# Patient Record
Sex: Female | Born: 1940
Health system: Southern US, Community
[De-identification: ages and names within clinical notes are randomized; demographics above are authoritative.]

## PROBLEM LIST (undated history)

## (undated) DIAGNOSIS — J449 Chronic obstructive pulmonary disease, unspecified: Secondary | ICD-10-CM

## (undated) DIAGNOSIS — K219 Gastro-esophageal reflux disease without esophagitis: Secondary | ICD-10-CM

## (undated) DIAGNOSIS — M797 Fibromyalgia: Secondary | ICD-10-CM

## (undated) HISTORY — PX: CHOLECYSTECTOMY: SHX55

## (undated) HISTORY — PX: BREAST SURGERY: SHX581

---

## 2014-02-28 ENCOUNTER — Inpatient Hospital Stay (HOSPITAL_COMMUNITY)
Admission: AD | Admit: 2014-02-28 | Discharge: 2014-03-03 | DRG: 247 | Disposition: A | Discharge status: Home / Self Care | Payer: Medicare Other | Source: Other Acute Inpatient Hospital | Attending: Cardiovascular Disease | Admitting: Cardiovascular Disease

## 2014-02-28 ENCOUNTER — Encounter (HOSPITAL_COMMUNITY): Payer: Self-pay | Admitting: Cardiology

## 2014-02-28 DIAGNOSIS — I209 Angina pectoris, unspecified: Secondary | ICD-10-CM | POA: Diagnosis not present

## 2014-02-28 DIAGNOSIS — Z7982 Long term (current) use of aspirin: Secondary | ICD-10-CM

## 2014-02-28 DIAGNOSIS — I2510 Atherosclerotic heart disease of native coronary artery without angina pectoris: Secondary | ICD-10-CM | POA: Diagnosis present

## 2014-02-28 DIAGNOSIS — I10 Essential (primary) hypertension: Secondary | ICD-10-CM | POA: Diagnosis present

## 2014-02-28 DIAGNOSIS — R079 Chest pain, unspecified: Secondary | ICD-10-CM | POA: Diagnosis not present

## 2014-02-28 DIAGNOSIS — Z79899 Other long term (current) drug therapy: Secondary | ICD-10-CM

## 2014-02-28 DIAGNOSIS — E785 Hyperlipidemia, unspecified: Secondary | ICD-10-CM | POA: Diagnosis present

## 2014-02-28 DIAGNOSIS — I959 Hypotension, unspecified: Secondary | ICD-10-CM | POA: Diagnosis not present

## 2014-02-28 DIAGNOSIS — I214 Non-ST elevation (NSTEMI) myocardial infarction: Secondary | ICD-10-CM | POA: Diagnosis present

## 2014-02-28 DIAGNOSIS — Z87891 Personal history of nicotine dependence: Secondary | ICD-10-CM

## 2014-02-28 HISTORY — DX: Gastro-esophageal reflux disease without esophagitis: K21.9

## 2014-02-28 HISTORY — DX: Fibromyalgia: M79.7

## 2014-02-28 LAB — CBC WITH DIFFERENTIAL/PLATELET
Basophils Absolute: 0 10*3/uL (ref 0.0–0.1)
Basophils Relative: 0 % (ref 0–1)
Eosinophils Absolute: 0.1 10*3/uL (ref 0.0–0.7)
Eosinophils Relative: 1 % (ref 0–5)
HCT: 38 % (ref 36.0–46.0)
Hemoglobin: 13 g/dL (ref 12.0–15.0)
Lymphocytes Relative: 36 % (ref 12–46)
Lymphs Abs: 4.4 10*3/uL — ABNORMAL HIGH (ref 0.7–4.0)
MCH: 31.9 pg (ref 26.0–34.0)
MCHC: 34.2 g/dL (ref 30.0–36.0)
MCV: 93.1 fL (ref 78.0–100.0)
Monocytes Absolute: 0.7 10*3/uL (ref 0.1–1.0)
Monocytes Relative: 6 % (ref 3–12)
Neutro Abs: 7 10*3/uL (ref 1.7–7.7)
Neutrophils Relative %: 57 % (ref 43–77)
Platelets: 326 10*3/uL (ref 150–400)
RBC: 4.08 MIL/uL (ref 3.87–5.11)
RDW: 12.5 % (ref 11.5–15.5)
WBC: 12.2 10*3/uL — ABNORMAL HIGH (ref 4.0–10.5)

## 2014-02-28 LAB — COMPREHENSIVE METABOLIC PANEL
ALT: 18 U/L (ref 0–35)
AST: 28 U/L (ref 0–37)
Albumin: 3.9 g/dL (ref 3.5–5.2)
Alkaline Phosphatase: 65 U/L (ref 39–117)
BUN: 15 mg/dL (ref 6–23)
CO2: 26 mEq/L (ref 19–32)
Calcium: 10 mg/dL (ref 8.4–10.5)
Chloride: 97 mEq/L (ref 96–112)
Creatinine, Ser: 0.7 mg/dL (ref 0.50–1.10)
GFR calc Af Amer: >90 mL/min (ref >90)
GFR calc non Af Amer: 83 mL/min — ABNORMAL LOW (ref >90)
Glucose, Bld: 175 mg/dL — ABNORMAL HIGH (ref 70–99)
Potassium: 4 mEq/L (ref 3.7–5.3)
Sodium: 139 mEq/L (ref 137–147)
Total Bilirubin: 0.4 mg/dL (ref 0.3–1.2)
Total Protein: 7.4 g/dL (ref 6.0–8.3)

## 2014-02-28 LAB — MRSA PCR SCREENING: MRSA by PCR: NEGATIVE

## 2014-02-28 MED ORDER — NITROGLYCERIN 0.4 MG SL SUBL
0.4000 mg | SUBLINGUAL_TABLET | SUBLINGUAL | Status: DC | PRN
  Filled 2014-02-28: qty 1

## 2014-02-28 MED ORDER — ALPRAZOLAM 0.25 MG PO TABS
0.2500 mg | ORAL_TABLET | Freq: Every day | ORAL | Status: DC
  Administered 2014-02-28 – 2014-03-01 (×2): 0.25 mg via ORAL
  Filled 2014-02-28 (×2): qty 1

## 2014-02-28 MED ORDER — METOPROLOL TARTRATE 12.5 MG HALF TABLET
12.5000 mg | ORAL_TABLET | Freq: Two times a day (BID) | ORAL | Status: DC
  Administered 2014-02-28 – 2014-03-03 (×5): 12.5 mg via ORAL
  Filled 2014-02-28 (×8): qty 1

## 2014-02-28 MED ORDER — SODIUM CHLORIDE 0.9 % IV SOLN
INTRAVENOUS | Status: DC
  Administered 2014-03-01: 20:00:00 via INTRAVENOUS

## 2014-02-28 MED ORDER — HEPARIN BOLUS VIA INFUSION
3000.0000 [IU] | Freq: Once | INTRAVENOUS | Status: AC
  Administered 2014-02-28: 3000 [IU] via INTRAVENOUS
  Filled 2014-02-28: qty 3000

## 2014-02-28 MED ORDER — HEPARIN (PORCINE) IN NACL 100-0.45 UNIT/ML-% IJ SOLN
INTRAMUSCULAR | Status: AC
  Filled 2014-02-28: qty 250

## 2014-02-28 MED ORDER — ACETAMINOPHEN 325 MG PO TABS
650.0000 mg | ORAL_TABLET | Freq: Three times a day (TID) | ORAL | Status: DC | PRN
  Administered 2014-02-28 – 2014-03-01 (×2): 650 mg via ORAL
  Filled 2014-02-28 (×2): qty 2

## 2014-02-28 MED ORDER — ASPIRIN EC 81 MG PO TBEC
81.0000 mg | DELAYED_RELEASE_TABLET | Freq: Every day | ORAL | Status: DC
  Administered 2014-03-03: 81 mg via ORAL
  Filled 2014-02-28 (×3): qty 1

## 2014-02-28 MED ORDER — SODIUM CHLORIDE 0.9 % IJ SOLN
3.0000 mL | Freq: Two times a day (BID) | INTRAMUSCULAR | Status: DC
  Administered 2014-03-01: 3 mL via INTRAVENOUS

## 2014-02-28 MED ORDER — SODIUM CHLORIDE 0.9 % IJ SOLN: 3.0000 mL | INTRAMUSCULAR | Status: DC | PRN

## 2014-02-28 MED ORDER — ACETAMINOPHEN ER 650 MG PO TBCR: 650.0000 mg | EXTENDED_RELEASE_TABLET | Freq: Three times a day (TID) | ORAL | Status: DC | PRN

## 2014-02-28 MED ORDER — ASPIRIN 81 MG PO CHEW: 324.0000 mg | CHEWABLE_TABLET | ORAL | Status: AC

## 2014-02-28 MED ORDER — NITROGLYCERIN IN D5W 200-5 MCG/ML-% IV SOLN
INTRAVENOUS | Status: AC
  Administered 2014-02-28: 10 ug
  Filled 2014-02-28: qty 250

## 2014-02-28 MED ORDER — HEPARIN (PORCINE) IN NACL 100-0.45 UNIT/ML-% IJ SOLN
800.0000 [IU]/h | INTRAMUSCULAR | Status: DC
  Administered 2014-02-28 – 2014-03-01 (×2): 800 [IU]/h via INTRAVENOUS
  Filled 2014-02-28 (×2): qty 250

## 2014-02-28 MED ORDER — PANTOPRAZOLE SODIUM 40 MG PO TBEC
40.0000 mg | DELAYED_RELEASE_TABLET | Freq: Every day | ORAL | Status: DC
  Administered 2014-02-28 – 2014-03-03 (×4): 40 mg via ORAL
  Filled 2014-02-28 (×4): qty 1

## 2014-02-28 MED ORDER — ASPIRIN 300 MG RE SUPP: 300.0000 mg | RECTAL | Status: AC

## 2014-02-28 MED ORDER — SODIUM CHLORIDE 0.9 % IV SOLN: 250.0000 mL | INTRAVENOUS | Status: DC | PRN

## 2014-02-28 MED ORDER — NITROGLYCERIN IN D5W 200-5 MCG/ML-% IV SOLN
3.0000 ug/min | INTRAVENOUS | Status: DC
Start: 2014-02-28 — End: 2014-03-02

## 2014-02-28 MED ORDER — ASPIRIN 81 MG PO CHEW
81.0000 mg | CHEWABLE_TABLET | ORAL | Status: AC
  Administered 2014-03-01: 81 mg via ORAL
  Filled 2014-02-28: qty 1

## 2014-02-28 MED ORDER — PNEUMOCOCCAL VAC POLYVALENT 25 MCG/0.5ML IJ INJ
0.5000 mL | INJECTION | INTRAMUSCULAR | Status: DC
  Filled 2014-02-28: qty 0.5

## 2014-02-28 NOTE — Progress Notes (Signed)
ANTICOAGULATION CONSULT NOTE - Initial Consult  Pharmacy Consult for heparin Indication: chest pain/ACS  Allergies  Allergen Reactions  . Erythromycin Nausea And Vomiting  . Sulfa Antibiotics Other (See Comments)    Dry mouth   . Penicillins Itching and Rash    Patient Measurements: Height: 5' (152.4 cm) Weight: 152 lb 1.9 oz (69 kg) IBW/kg (Calculated) : 45.5 Heparin Dosing Weight: 55 kg  Vital Signs: Temp: 98.5 F (36.9 C) (04/15 1711) Temp src: Oral (04/15 1711) BP: 145/78 mmHg (04/15 1830) Pulse Rate: 68 (04/15 1830)  Labs: No results found for this basename: HGB, HCT, PLT, APTT, LABPROT, INR, HEPARINUNFRC, CREATININE, CKTOTAL, CKMB, TROPONINI,  in the last 72 hours  CrCl is unknown because no creatinine reading has been taken.   Medical History: Past Medical History  Diagnosis Date  . GERD (gastroesophageal reflux disease)   . Fibromyalgia     Medications:  Prescriptions prior to admission  Medication Sig Dispense Refill  . Acetaminophen (TYLENOL ARTHRITIS PAIN PO) Take 1 tablet by mouth 2 (two) times daily as needed (pain).      Marland Kitchen ALPRAZolam (XANAX) 0.5 MG tablet Take 0.25 mg by mouth at bedtime.       Marland Kitchen losartan-hydrochlorothiazide (HYZAAR) 100-25 MG per tablet Take 1 tablet by mouth daily.       . Misc Natural Products (FOCUSED MIND PO) Take 1 tablet by mouth daily.      Marland Kitchen neomycin-polymyxin-dexameth (MAXITROL) 0.1 % OINT Place 1 application into both eyes See admin instructions. Apply to eyes the first week of every month for 7 days.      Marland Kitchen omeprazole (PRILOSEC) 20 MG capsule Take 20 mg by mouth daily.       . Red Yeast Rice Extract (RED YEAST RICE PO) Take 2 tablets by mouth daily.        Assessment: 73 yo F transferred from [facility] to [facility] w/ left sided chest pain.  Per her RN she was not given any heparin or LMWH at [facility], only asa and sl ntg.  Wt 69 kg, ABW = 55 kg.   No labs at [facility].  Troponin at [facility] 0.05.  Hypertensive w/ BP 226/112 -  treated with IV NTG.  Plan for cath in am.   Goal of Therapy:  Heparin level 0.3-0.7 units/ml Monitor platelets by anticoagulation protocol: Yes   Plan:  Give 3000 units bolus x 1, followed by drip at 800 units/hr.  Check 8 hr HL.  Daily HL and CBC while on heparin.    Eudelia Bunch, Pharm.D. [phone] 02/28/2014 7:11 PM

## 2014-02-28 NOTE — H&P (Signed)
Chief Complaint: Chest Pain  HPI: The patient is a 73 y/o female with a history of HTN and HLD (intolerant to statins) and no prior cardiac history who presented to [facility] today with a complaint of sudden onset left sided chest pain with radiation to the back and left upper extremity, while exercising in an aerobics class this am. She noted associated n/v and profuse diaphoresis. No significant SOB. She left her aerobics class and went home, however her symptoms persisted, prompting her to report to the ER.   At [facility], troponin was positive at 0.05 and she was transferred to [facility] for further care. She was given ASA and SL NTG and her pain improved. She is still with mild left arm discomfort. She is also hypertensive with a BP of 213/102.  It should also be noted that she has a family history of CAD, with a sister who had a MI in her 72s. She also has a remote history of tobacco use, having quit 16 yrs ago.   No past medical history on file.  No past surgical history on file.  Family History  Problem Relation Age of Onset  . Coronary artery disease Sister 53    MI   Social History:  has no tobacco, alcohol, and drug history on file.  Allergies:  Allergies  Allergen Reactions  . Erythromycin Nausea And Vomiting  . Sulfa Antibiotics Other (See Comments)    Dry mouth   . Penicillins Itching and Rash    Medications Prior to Admission  Medication Sig Dispense Refill  . Acetaminophen (TYLENOL ARTHRITIS PAIN PO) Take 1 tablet by mouth 2 (two) times daily as needed (pain).      Marland Kitchen ALPRAZolam (XANAX) 0.5 MG tablet Take 0.25 mg by mouth at bedtime.       Marland Kitchen losartan-hydrochlorothiazide (HYZAAR) 100-25 MG per tablet Take 1 tablet by mouth daily.       . Misc Natural Products (FOCUSED MIND PO) Take 1 tablet by mouth daily.      Marland Kitchen neomycin-polymyxin-dexameth (MAXITROL) 0.1 % OINT Place 1 application into both eyes See admin instructions. Apply to eyes the first week of every month  for 7 days.      Marland Kitchen omeprazole (PRILOSEC) 20 MG capsule Take 20 mg by mouth daily.       . Red Yeast Rice Extract (RED YEAST RICE PO) Take 2 tablets by mouth daily.        No results found for this or any previous visit (from the past 48 hour(s)). No results found.  Review of Systems  Constitutional: Positive for diaphoresis.  Cardiovascular: Positive for chest pain.  Gastrointestinal: Positive for nausea and vomiting.  All other systems reviewed and are negative.   Blood pressure 206/80, pulse 63, temperature 98.5 F (36.9 C), temperature source Oral, height 5' (1.524 m), weight 152 lb 1.9 oz (69 kg), SpO2 100.00%. Physical Exam  Constitutional: She is oriented to person, place, and time. She appears well-developed and well-nourished. No distress.  Neck: No JVD present.  Cardiovascular: Normal rate, regular rhythm, normal heart sounds and intact distal pulses.  Exam reveals no gallop and no friction rub.   No murmur heard. Respiratory: Effort normal and breath sounds normal. No respiratory distress. She has no wheezes. She has no rales.  GI: Soft. Bowel sounds are normal. She exhibits no distension and no mass. There is no tenderness.  Musculoskeletal: She exhibits no edema.  Neurological: She is alert and oriented to person, place, and  time.  Skin: Skin is warm. She is not diaphoretic.  Psychiatric: She has a normal mood and affect. Her behavior is normal.     Assessment/Plan Principal Problem:   NSTEMI (non-ST elevated myocardial infarction) Active Problems:   HTN (hypertension)   HLD (hyperlipidemia)    Plan: Currently CP free. EKG with mild ST abnormalities. Will admit to CCU. Will cycle troponins. Will place on IV heparin and IV nitro. Will add a BB. She refuses statin therapy. Will obtain a 2D echo and plan for a LHC +/- PCI in the am. MD to follow with further recommendations.    Brittainy Simmons 02/28/2014, 6:21 PM   Patient seen and examined. Agree with  assessment and plan. Very pleasant 73 yo WF originally fro [place] with a h/o HTN, HLD who presents in transfer today from [facility] after developing exertional chest pressure associated with diaphoresis, nausea, L arm radiation. ECG suggests mild ST changes anterolaterally with early transition. Initial Trop 0.05. Currently hypertensive with BP 226/112 for which IV NTYG started and being titrated to keep BP<140 and > 110. Will heparinize. Discussed cardiac cath; plan tomorrow.   Troy Sine, MD, [facility] 02/28/2014 6:23 PM

## 2014-03-01 DIAGNOSIS — I369 Nonrheumatic tricuspid valve disorder, unspecified: Secondary | ICD-10-CM

## 2014-03-01 LAB — BASIC METABOLIC PANEL
BUN: 14 mg/dL (ref 6–23)
CO2: 24 mEq/L (ref 19–32)
Calcium: 9.4 mg/dL (ref 8.4–10.5)
Chloride: 94 mEq/L — ABNORMAL LOW (ref 96–112)
Creatinine, Ser: 0.73 mg/dL (ref 0.50–1.10)
GFR calc Af Amer: >90 mL/min (ref >90)
GFR calc non Af Amer: 82 mL/min — ABNORMAL LOW (ref >90)
Glucose, Bld: 104 mg/dL — ABNORMAL HIGH (ref 70–99)
Potassium: 3.6 mEq/L — ABNORMAL LOW (ref 3.7–5.3)
Sodium: 137 mEq/L (ref 137–147)

## 2014-03-01 LAB — HEPARIN LEVEL (UNFRACTIONATED)
Heparin Unfractionated: 0.38 IU/mL (ref 0.30–0.70)
Heparin Unfractionated: 0.43 IU/mL (ref 0.30–0.70)

## 2014-03-01 LAB — PROTIME-INR
INR: 0.97 (ref 0.00–1.49)
Prothrombin Time: 12.7 seconds (ref 11.6–15.2)

## 2014-03-01 LAB — TROPONIN I
Troponin I: 0.59 ng/mL (ref <0.30)
Troponin I: 0.53 ng/mL (ref <0.30)
Troponin I: 1.57 ng/mL (ref <0.30)

## 2014-03-01 LAB — LIPID PANEL
Cholesterol: 289 mg/dL — ABNORMAL HIGH (ref 0–200)
HDL: 67 mg/dL (ref >39)
LDL Cholesterol: 189 mg/dL — ABNORMAL HIGH (ref 0–99)
Total CHOL/HDL Ratio: 4.3 RATIO
Triglycerides: 163 mg/dL — ABNORMAL HIGH (ref <150)
VLDL: 33 mg/dL (ref 0–40)

## 2014-03-01 MED ORDER — ATORVASTATIN CALCIUM 40 MG PO TABS
40.0000 mg | ORAL_TABLET | Freq: Every day | ORAL | Status: DC
  Administered 2014-03-03: 40 mg via ORAL
  Filled 2014-03-01 (×3): qty 1

## 2014-03-01 NOTE — Care Management Note (Addendum)
  Page 2 of 2   03/05/2014     11:54:37 AM CARE MANAGEMENT NOTE 03/05/2014  Patient:  Jodi Spencer, Jodi Spencer   Account Number:  [id number]  Date Initiated:  03/01/2014  Documentation initiated by:  Elissa Hefty  Subjective/Objective Assessment:   adm w mi     Action/Plan:   lives w husband, pcp dr Tawni Carnes   Anticipated DC Date:  03/03/2014   Anticipated DC Plan:  [place]  CM consult  Medication Assistance      Choice offered to / List presented to:             Status of service:  Completed, signed off Medicare Important Message given?   (If response is "NO", the following Medicare IM given date fields will be blank) Date Medicare IM given:   Date Additional Medicare IM given:    Discharge Disposition:  HOME/SELF CARE  Per UR Regulation:  Reviewed for med. necessity/level of care/duration of stay  If discussed at [place] of Stay Meetings, dates discussed:    Comments:  03/03/2014 D/c home / self care on 03/03/2014 Paulino Cork RN, BSN, MSHL, CCM 03/05/2014   03/02/2014 CM provided update and Benefits check to patient. Brilinta card provided to patient for 30 day free Rx with instructions on activating card prior to pick up at pharmcy.  Take card to pharmacy at time of pick up. CM provided medication assistance application to use as needed and Rx MD can assist with processing. CM advised that Brilinta may not be needed at time of d/c and information provided is only in the event needed. Stedman Summerville RN, BSN, MSHL, CCM 03/02/2014   BRILINTA 90 MG BID  FOR 30 DAY SUPPLY [employer]HC  # [phone] COVER - YES TIER-4 DRUG CO-PAY $ 80.00 PRIOR APPROVAL -N- PREFERRED PHARMACY:  [facility]  ,[facility] , [facility] , [facility] , [facility]  Social:  From home with husband PCP:  Dr. Tawni Carnes  CMA request sent---03/02/2014 1056 by Morelia Cassells--- Benefits Check: Brilinta 90 mg by mouth twice per day Please check  coverage, co-pay, authorizations, deductibles, and preferred pharmacy. Thanks, [employer] RN, BSN, [place], CCM 03/02/2014

## 2014-03-01 NOTE — Progress Notes (Signed)
  Echocardiogram 2D Echocardiogram has been performed.  Jodi Spencer 03/01/2014, 12:28 PM

## 2014-03-01 NOTE — Progress Notes (Signed)
   Subjective:  Had recurrent chest this am  Objective:   Vital Signs in the last 24 hours: Temp:  [98.4 F (36.9 C)-98.7 F (37.1 C)] 98.7 F (37.1 C) (04/16 1200) Pulse Rate:  [42-72] 51 (04/16 1200) Resp:  [10-21] 14 (04/16 1200) BP: (81-226)/(38-108) 123/58 mmHg (04/16 1200) SpO2:  [92 %-100 %] 94 % (04/16 1200) Weight:  [151 lb (68.493 kg)-152 lb 1.9 oz (69 kg)] 151 lb (68.493 kg) (04/16 0400)  Intake/Output from previous day: 04/15 0701 - 04/16 0700 In: 476.5 [I.V.:476.5] Out: 850 [Urine:850]  Medications: . ALPRAZolam  0.25 mg Oral QHS  . aspirin  324 mg Oral NOW   Or  . aspirin  300 mg Rectal NOW  . aspirin EC  81 mg Oral Daily  . metoprolol tartrate  12.5 mg Oral BID  . pantoprazole  40 mg Oral Daily  . pneumococcal 23 valent vaccine  0.5 mL Intramuscular Tomorrow-1000  . sodium chloride  3 mL Intravenous Q12H    . sodium chloride 75 mL/hr at 03/01/14 0800  . heparin 800 Units/hr (03/01/14 0800)  . nitroGLYCERIN 30 mcg/min (03/01/14 1032)    Physical Exam:   General appearance: alert, cooperative and no distress Neck: no adenopathy, no carotid bruit, no JVD, supple, symmetrical, trachea midline and thyroid not enlarged, symmetric, no tenderness/mass/nodules Lungs: decreased BS at bases Heart: regular rate and rhythm and 1/6 sem Abdomen: soft, non-tender; bowel sounds normal; no masses,  no organomegaly Extremities: extremities normal, atraumatic, no cyanosis or edema Pulses: 2+ and symmetric Skin: Skin color, texture, turgor normal. No rashes or lesions Neurologic: Grossly normal   Rate: 55  Rhythm: normal sinus rhythm  ECG: SB with mild RV conduction delay  Lab Results:    Recent Labs  02/28/14 2050 03/01/14 0450  NA 139 137  K 4.0 3.6*  CL 97 94*  CO2 26 24  GLUCOSE 175* 104*  BUN 15 14  CREATININE 0.70 0.73    Recent Labs  03/01/14 0450 03/01/14 1050  TROPONINI 0.53* 1.57*   Hepatic Function Panel  Recent Labs   02/28/14 2050  PROT 7.4  ALBUMIN 3.9  AST 28  ALT 18  ALKPHOS 65  BILITOT 0.4    Recent Labs  03/01/14 0450  INR 0.97   BNP (last 3 results) No results found for this basename: PROBNP,  in the last 8760 hours  Lipid Panel     Component Value Date/Time   CHOL 289* 03/01/2014 0450   TRIG 163* 03/01/2014 0450   HDL 67 03/01/2014 0450   CHOLHDL 4.3 03/01/2014 0450   VLDL 33 03/01/2014 0450   LDLCALC 189* 03/01/2014 0450      Imaging:  No results found.    Assessment/Plan:   Principal Problem:   NSTEMI (non-ST elevated myocardial infarction) Active Problems:   HTN (hypertension)   HLD (hyperlipidemia)   Echo: - Left ventricle: The cavity size was normal. Wall thickness was increased in a pattern of mild LVH. Systolic function was normal. The estimated ejection fraction was in the range of 50% to 55%. Wall motion was normal; there were no regional wall motion abnormalities. Doppler parameters are consistent with abnormal left ventricular relaxation (grade 1 diastolic dysfunction). - Right atrium: The atrium was mildly dilated.  Pt is ruling in for NSTEMI. Trop increased to 1.57. Discussed cath with possible PCI to be done. Start statin with significantly increased LDL. Continue IV NTG; cath later today.   Sari Cogan A. Hazelyn Kallen, MD, FACC 03/01/2014, 3:25 PM 

## 2014-03-01 NOTE — H&P (View-Only) (Signed)
   Subjective:  Had recurrent chest this am  Objective:   Vital Signs in the last 24 hours: Temp:  [98.4 F (36.9 C)-98.7 F (37.1 C)] 98.7 F (37.1 C) (04/16 1200) Pulse Rate:  [42-72] 51 (04/16 1200) Resp:  [10-21] 14 (04/16 1200) BP: (81-226)/(38-108) 123/58 mmHg (04/16 1200) SpO2:  [92 %-100 %] 94 % (04/16 1200) Weight:  [151 lb (68.493 kg)-152 lb 1.9 oz (69 kg)] 151 lb (68.493 kg) (04/16 0400)  Intake/Output from previous day: 04/15 0701 - 04/16 0700 In: 476.5 [I.V.:476.5] Out: 850 [Urine:850]  Medications: . ALPRAZolam  0.25 mg Oral QHS  . aspirin  324 mg Oral NOW   Or  . aspirin  300 mg Rectal NOW  . aspirin EC  81 mg Oral Daily  . metoprolol tartrate  12.5 mg Oral BID  . pantoprazole  40 mg Oral Daily  . pneumococcal 23 valent vaccine  0.5 mL Intramuscular Tomorrow-1000  . sodium chloride  3 mL Intravenous Q12H    . sodium chloride 75 mL/hr at 03/01/14 0800  . heparin 800 Units/hr (03/01/14 0800)  . nitroGLYCERIN 30 mcg/min (03/01/14 1032)    Physical Exam:   General appearance: alert, cooperative and no distress Neck: no adenopathy, no carotid bruit, no JVD, supple, symmetrical, trachea midline and thyroid not enlarged, symmetric, no tenderness/mass/nodules Lungs: decreased BS at bases Heart: regular rate and rhythm and 1/6 sem Abdomen: soft, non-tender; bowel sounds normal; no masses,  no organomegaly Extremities: extremities normal, atraumatic, no cyanosis or edema Pulses: 2+ and symmetric Skin: Skin color, texture, turgor normal. No rashes or lesions Neurologic: Grossly normal   Rate: 55  Rhythm: normal sinus rhythm  ECG: SB with mild RV conduction delay  Lab Results:    Recent Labs  02/28/14 2050 03/01/14 0450  NA 139 137  K 4.0 3.6*  CL 97 94*  CO2 26 24  GLUCOSE 175* 104*  BUN 15 14  CREATININE 0.70 0.73    Recent Labs  03/01/14 0450 03/01/14 1050  TROPONINI 0.53* 1.57*   Hepatic Function Panel  Recent Labs   02/28/14 2050  PROT 7.4  ALBUMIN 3.9  AST 28  ALT 18  ALKPHOS 65  BILITOT 0.4    Recent Labs  03/01/14 0450  INR 0.97   BNP (last 3 results) No results found for this basename: PROBNP,  in the last 8760 hours  Lipid Panel     Component Value Date/Time   CHOL 289* 03/01/2014 0450   TRIG 163* 03/01/2014 0450   HDL 67 03/01/2014 0450   CHOLHDL 4.3 03/01/2014 0450   VLDL 33 03/01/2014 0450   LDLCALC 189* 03/01/2014 0450      Imaging:  No results found.    Assessment/Plan:   Principal Problem:   NSTEMI (non-ST elevated myocardial infarction) Active Problems:   HTN (hypertension)   HLD (hyperlipidemia)   Echo: - Left ventricle: The cavity size was normal. Wall thickness was increased in a pattern of mild LVH. Systolic function was normal. The estimated ejection fraction was in the range of 50% to 55%. Wall motion was normal; there were no regional wall motion abnormalities. Doppler parameters are consistent with abnormal left ventricular relaxation (grade 1 diastolic dysfunction). - Right atrium: The atrium was mildly dilated.  Pt is ruling in for NSTEMI. Trop increased to 1.57. Discussed cath with possible PCI to be done. Start statin with significantly increased LDL. Continue IV NTG; cath later today.   Troy Sine, MD, [facility] 03/01/2014, 3:25 PM

## 2014-03-01 NOTE — Progress Notes (Signed)
ANTICOAGULATION CONSULT NOTE - Follow Up Consult  Pharmacy Consult for Heparin  Indication: chest pain/ACS  Allergies  Allergen Reactions  . Erythromycin Nausea And Vomiting  . Sulfa Antibiotics Other (See Comments)    Dry mouth   . Penicillins Itching and Rash    Patient Measurements: Height: 5' (152.4 cm) Weight: 151 lb (68.493 kg) IBW/kg (Calculated) : 45.5  Vital Signs: Temp: 98.4 F (36.9 C) (04/16 0400) Temp src: Oral (04/16 0400) BP: 129/57 mmHg (04/16 0400) Pulse Rate: 49 (04/16 0400)  Labs:  Recent Labs  02/28/14 2050 03/01/14 0042 03/01/14 0450  HGB 13.0  --   --   HCT 38.0  --   --   PLT 326  --   --   LABPROT  --   --  12.7  INR  --   --  0.97  HEPARINUNFRC  --   --  0.38  CREATININE 0.70  --  0.73  TROPONINI  --  0.59* 0.53*   Estimated Creatinine Clearance: 53.3 ml/min (by C-G formula based on Cr of 0.73).  Medications:  Heparin 800 units/hr  Assessment: 73 y/o F on heparin for CP. First HL is 0.38. Other labs as above.   Goal of Therapy:  Heparin level 0.3-0.7 units/ml Monitor platelets by anticoagulation protocol: Yes   Plan:  -Continue heparin drip at 800 units/hr -1300 HL to confirm -Daily CBC/HL -Monitor for bleeding  Narda Bonds 03/01/2014,5:55 AM

## 2014-03-01 NOTE — Progress Notes (Signed)
CRITICAL VALUE ALERT  Critical value received:  Trop 0.59  Date of notification: 03/01/14  Time of notification: 0150  Critical value read back:yes  Nurse who received alert:  Melene Plan RN MD notified (1st page):  Dr Ulyses Amor  Time of first page:  0200  MD notified (2nd page):  Time of second page:  Responding MD:  Dr Ulyses Amor  Time MD responded: [phone]

## 2014-03-01 NOTE — Progress Notes (Signed)
ANTICOAGULATION CONSULT NOTE - Follow Up Consult  Pharmacy Consult for Heparin  Indication: chest pain/ACS  Allergies  Allergen Reactions  . Erythromycin Nausea And Vomiting  . Sulfa Antibiotics Other (See Comments)    Dry mouth   . Penicillins Itching and Rash    Patient Measurements: Height: 5' (152.4 cm) Weight: 151 lb (68.493 kg) IBW/kg (Calculated) : 45.5  Vital Signs: Temp: 98.7 F (37.1 C) (04/16 1200) Temp src: Oral (04/16 1200) BP: 123/58 mmHg (04/16 1200) Pulse Rate: 51 (04/16 1200)  Labs:  Recent Labs  02/28/14 2050 03/01/14 0042 03/01/14 0450 03/01/14 1050 03/01/14 1255  HGB 13.0  --   --   --   --   HCT 38.0  --   --   --   --   PLT 326  --   --   --   --   LABPROT  --   --  12.7  --   --   INR  --   --  0.97  --   --   HEPARINUNFRC  --   --  0.38  --  0.43  CREATININE 0.70  --  0.73  --   --   TROPONINI  --  0.59* 0.53* 1.57*  --    Estimated Creatinine Clearance: 53.3 ml/min (by C-G formula based on Cr of 0.73).  Medications:  Heparin 800 units/hr  Assessment: 73 y/o F on heparin for CP. HL remains therapeutic at 0.43 this AM. Other labs as above. Troponin trending up. Plan for cath today.   Goal of Therapy:  Heparin level 0.3-0.7 units/ml Monitor platelets by anticoagulation protocol: Yes   Plan:  -Continue heparin drip at 800 units/hr -Daily CBC/HL -Monitor for bleeding -f/u after cath   Albertina Parr, PharmD.  Clinical Pharmacist Pager [phone]

## 2014-03-01 NOTE — Interval H&P Note (Signed)
History and Physical Interval Note:  03/01/2014 5:48 PM  Jodi Spencer  has presented today for cardiac cath with the diagnosis of chest pain/NSTEMI.  The various methods of treatment have been discussed with the patient and family. After consideration of risks, benefits and other options for treatment, the patient has consented to  Procedure(s): LEFT HEART CATHETERIZATION WITH CORONARY ANGIOGRAM (N/A) as a surgical intervention .  The patient's history has been reviewed, patient examined, no change in status, stable for surgery.  I have reviewed the patient's chart and labs.  Questions were answered to the patient's satisfaction.    Cath Lab Visit (complete for each Cath Lab visit)  Clinical Evaluation Leading to the Procedure:   ACS: yes  Non-ACS:    Anginal Classification: CCS IV  Anti-ischemic medical therapy: No Therapy  Non-Invasive Test Results: No non-invasive testing performed  Prior CABG: No previous CABG        Burnell Blanks

## 2014-03-02 ENCOUNTER — Other Ambulatory Visit: Payer: Self-pay

## 2014-03-02 ENCOUNTER — Encounter (HOSPITAL_COMMUNITY)
Admission: AD | Disposition: A | Discharge status: Home / Self Care | Payer: Self-pay | Source: Other Acute Inpatient Hospital | Attending: Cardiovascular Disease

## 2014-03-02 ENCOUNTER — Encounter (HOSPITAL_COMMUNITY)
Admission: AD | Disposition: A | Discharge status: Home / Self Care | Payer: Medicare Other | Source: Other Acute Inpatient Hospital | Attending: Cardiovascular Disease

## 2014-03-02 DIAGNOSIS — I2510 Atherosclerotic heart disease of native coronary artery without angina pectoris: Secondary | ICD-10-CM

## 2014-03-02 DIAGNOSIS — I214 Non-ST elevation (NSTEMI) myocardial infarction: Principal | ICD-10-CM

## 2014-03-02 HISTORY — PX: PERCUTANEOUS CORONARY STENT INTERVENTION (PCI-S): SHX5485

## 2014-03-02 HISTORY — PX: LEFT HEART CATHETERIZATION WITH CORONARY ANGIOGRAM: SHX5451

## 2014-03-02 LAB — CBC
HCT: 30.4 % — ABNORMAL LOW (ref 36.0–46.0)
Hemoglobin: 10.3 g/dL — ABNORMAL LOW (ref 12.0–15.0)
MCH: 31.6 pg (ref 26.0–34.0)
MCHC: 33.9 g/dL (ref 30.0–36.0)
MCV: 93.3 fL (ref 78.0–100.0)
Platelets: 245 10*3/uL (ref 150–400)
RBC: 3.26 MIL/uL — ABNORMAL LOW (ref 3.87–5.11)
RDW: 12.8 % (ref 11.5–15.5)
WBC: 8.2 10*3/uL (ref 4.0–10.5)

## 2014-03-02 LAB — POCT ACTIVATED CLOTTING TIME
Activated Clotting Time: 254 seconds
Activated Clotting Time: 298 seconds

## 2014-03-02 LAB — HEPARIN LEVEL (UNFRACTIONATED): Heparin Unfractionated: 0.3 IU/mL (ref 0.30–0.70)

## 2014-03-02 LAB — PLATELET COUNT: Platelets: 259 10*3/uL (ref 150–400)

## 2014-03-02 SURGERY — LEFT HEART CATHETERIZATION WITH CORONARY ANGIOGRAM
Anesthesia: LOCAL

## 2014-03-02 MED ORDER — POTASSIUM CHLORIDE CRYS ER 20 MEQ PO TBCR
20.0000 meq | EXTENDED_RELEASE_TABLET | Freq: Once | ORAL | Status: AC
  Administered 2014-03-02: 20 meq via ORAL
  Filled 2014-03-02: qty 1

## 2014-03-02 MED ORDER — FENTANYL CITRATE 0.05 MG/ML IJ SOLN
INTRAMUSCULAR | Status: AC
  Filled 2014-03-02: qty 2

## 2014-03-02 MED ORDER — MIDAZOLAM HCL 2 MG/2ML IJ SOLN
INTRAMUSCULAR | Status: AC
  Filled 2014-03-02: qty 2

## 2014-03-02 MED ORDER — ALPRAZOLAM 0.25 MG PO TABS
0.2500 mg | ORAL_TABLET | Freq: Two times a day (BID) | ORAL | Status: DC | PRN
  Administered 2014-03-02: 0.25 mg via ORAL
  Filled 2014-03-02: qty 1

## 2014-03-02 MED ORDER — TIROFIBAN HCL IV 5 MG/100ML
INTRAVENOUS | Status: AC
  Filled 2014-03-02: qty 100

## 2014-03-02 MED ORDER — ONDANSETRON HCL 4 MG/2ML IJ SOLN
INTRAMUSCULAR | Status: AC
  Filled 2014-03-02: qty 2

## 2014-03-02 MED ORDER — HEPARIN (PORCINE) IN NACL 2-0.9 UNIT/ML-% IJ SOLN
INTRAMUSCULAR | Status: AC
  Filled 2014-03-02: qty 1000

## 2014-03-02 MED ORDER — HYDRALAZINE HCL 20 MG/ML IJ SOLN
INTRAMUSCULAR | Status: AC
  Administered 2014-03-02: 20 mg
  Filled 2014-03-02: qty 1

## 2014-03-02 MED ORDER — LIDOCAINE HCL (PF) 1 % IJ SOLN
INTRAMUSCULAR | Status: AC
  Filled 2014-03-02: qty 30

## 2014-03-02 MED ORDER — LOSARTAN POTASSIUM 50 MG PO TABS
50.0000 mg | ORAL_TABLET | Freq: Every day | ORAL | Status: DC
  Administered 2014-03-02 – 2014-03-03 (×2): 50 mg via ORAL
  Filled 2014-03-02 (×2): qty 1

## 2014-03-02 MED ORDER — SODIUM CHLORIDE 0.9 % IV SOLN: 1.0000 mL/kg/h | INTRAVENOUS | Status: AC

## 2014-03-02 MED ORDER — TICAGRELOR 90 MG PO TABS
90.0000 mg | ORAL_TABLET | Freq: Two times a day (BID) | ORAL | Status: DC
  Administered 2014-03-02 – 2014-03-03 (×2): 90 mg via ORAL
  Filled 2014-03-02 (×3): qty 1

## 2014-03-02 MED ORDER — TICAGRELOR 90 MG PO TABS
ORAL_TABLET | ORAL | Status: AC
  Administered 2014-03-02: 90 mg via ORAL
  Filled 2014-03-02: qty 2

## 2014-03-02 MED ORDER — TIROFIBAN HCL IV 5 MG/100ML
0.1500 ug/kg/min | INTRAVENOUS | Status: AC
  Filled 2014-03-02: qty 100

## 2014-03-02 MED ORDER — NITROGLYCERIN 0.2 MG/ML ON CALL CATH LAB
INTRAVENOUS | Status: AC
  Filled 2014-03-02: qty 1

## 2014-03-02 MED ORDER — ASPIRIN 81 MG PO CHEW: 81.0000 mg | CHEWABLE_TABLET | Freq: Every day | ORAL | Status: DC

## 2014-03-02 MED ORDER — HYDRALAZINE HCL 20 MG/ML IJ SOLN: 10.0000 mg | INTRAMUSCULAR | Status: DC | PRN

## 2014-03-02 MED ORDER — ONDANSETRON HCL 4 MG/2ML IJ SOLN
4.0000 mg | Freq: Four times a day (QID) | INTRAMUSCULAR | Status: DC | PRN
  Administered 2014-03-02: 4 mg via INTRAVENOUS

## 2014-03-02 NOTE — Progress Notes (Signed)
Patient had notable swelling in right had when assessed, elevated on 1 pillow at this time, increased elevation on 3 pillows and cold ice bag to hand and forearm, bruising slightly increased in proximal area to TR band, aggrastat discontinued at this time as ordered. Patient given available cardiac medications, scheduled medications that were held prior to cath. No chest pain. Patient has complaints of shortness of breath, oxygen saturation in high 90% on room air. Lungs clear. Given caffeine to decrease symptoms as patient received loading dose of brilinta in cath lab. Patient continues to complain of shortness of breath. Encouraged to try cola as coffee had little effect.

## 2014-03-02 NOTE — Progress Notes (Addendum)
Asked to see pt due to R hand swelling, chest pressure, elevated BP. Cecilie Kicks NP ordered IV hydralazine to help lower BP along with Zofran for nausea. D/w Dr. Irish Lack. EKG unremarkable except possible mild U waves in anterior leads - will give a dose of KCl given K 3.6 yesterday. Residual chest pressure may be due to elevated BP. Will carefully lower BP since she did have episodes of hypotension overnight. Will restart Cozaar 50mg  daily today and follow BP post-hydralazine. If it continues to be an issue we can restart IV NTG.  Will keep arm elevated and continue to observe for improvement in edema. Have also asked nursing to help confirm she did get ASA this AM. Melina Copa PA-C

## 2014-03-02 NOTE — Interval H&P Note (Signed)
Cath Lab Visit (complete for each Cath Lab visit)  Clinical Evaluation Leading to the Procedure:   ACS: yes  Non-ACS:    Anginal Classification: CCS IV  Anti-ischemic medical therapy: Maximal Therapy (2 or more classes of medications)  Non-Invasive Test Results: No non-invasive testing performed  Prior CABG: No previous CABG      History and Physical Interval Note:  03/02/2014 7:44 AM  Ammie Dalton  has presented today for surgery, with the diagnosis of CP  The various methods of treatment have been discussed with the patient and family. After consideration of risks, benefits and other options for treatment, the patient has consented to  Procedure(s): LEFT HEART CATHETERIZATION WITH CORONARY ANGIOGRAM (N/A) as a surgical intervention .  The patient's history has been reviewed, patient examined, no change in status, stable for surgery.  I have reviewed the patient's chart and labs.  Questions were answered to the patient's satisfaction.     Jettie Booze

## 2014-03-02 NOTE — CV Procedure (Signed)
       PROCEDURE:  Left heart catheterization with selective coronary angiography, left ventriculogram.  Aspiration thrombectomy of the RCA.  PCI of the RCA.  INDICATIONS:  NSTEMI  The risks, benefits, and details of the procedure were explained to the patient.  The patient verbalized understanding and wanted to proceed.  Informed written consent was obtained.  PROCEDURE TECHNIQUE:  After Xylocaine anesthesia a 48F slender sheath was placed in the right radial artery with a single anterior needle wall stick.   IV Heparin was given. Right coronary angiography was done using a Judkins R4 guide catheter.  Left coronary angiography was done using a Judkins L3.5 guide catheter.  Left heart cath was done using a pigtail catheter.  A TR band was used for hemostasis.   CONTRAST:  Total of 115 cc.  COMPLICATIONS:  None.    HEMODYNAMICS:  Aortic pressure was 125/61; LV pressure was 127/4; LVEDP 12.  There was no gradient between the left ventricle and aorta.    ANGIOGRAPHIC DATA:   The left main coronary artery is widely patent.  The left anterior descending artery is a large vessel which reaches the apex.  There is a moderate proximal to mid stenosis which is diffuse.  The most severe portion appears to be 40-50%.  The first diagonal is small and has a mild ostial stenosis.  The second diagonal is large and widely patent.  The left circumflex artery is a large vessel proximally.  There are 2 small OM vessels.  The third OM is the largest and there is a mild stenosis at the bifurcation.  There is a large ramus vessel.  The right coronary artery is a large dominant vessel.  There is a moderate area of disease in the mid portion followed by a 99% thrombotic stenosis.  The posterolateral artery and PDA are large and patent.  The ostial PDA has a mild stenosis.  LEFT VENTRICULOGRAM:  Left ventricular angiogram was not done in the 30 RAO projection.   LVEDP was 12 mmHg.  PCI NARRATIVE: A JR 4 Guide  catheter was used.  IV heparin and tirofiban were used.  ACT was used to check that the anticoagulation was therapeutic.  A prowater wire was placed across the lesion.  A 2.5 x 15 balloon was used to predilate.  There was visible thrombus in the mid RCA post balloon.  An aspiration catheter was successfully used to remove the thrombus.  A 3.0 x 23 Xience DES was deployed and postdilated with a 3.5 x 15 [place] balloon.  There was an excellent angiographic result with no residual stenosis.  IC NTG was used.  IMPRESSIONS:  1. Normal left main coronary artery. 2. Moderate proximal to mid disease in the left anterior descending artery. 3. Mild disease in the left circumflex artery and its branches. 4. 99% thrombotic lesion in the mid right coronary artery, successfully treated with a 3.0 x 23 Xience Drug eluting stent, postdilated to 3.5 mm in diameter. 5. Left ventricular systolic function not assessed.  LVEDP 12 mmHg.   RECOMMENDATION:  Continue dual antiplatelet therapy for at least a year.  She will need aggressive secondary prevention.  She will need cardiology f/u given her moderate LAD disease as well.

## 2014-03-02 NOTE — Progress Notes (Signed)
TR BAND REMOVAL  LOCATION:    right radial  DEFLATED PER PROTOCOL:    yes  TIME BAND OFF / DRESSING APPLIED:   2100, cleansed w/ CHG wipes and redressed w/ 2x2/tegaderm  SITE UPON ARRIVAL:    Level 1  SITE AFTER BAND REMOVAL:    Level 1, very bruised and puffy  REVERSE ALLEN'S TEST:     positive  CIRCULATION SENSATION AND MOVEMENT:    Within Normal Limits   yes  COMMENTS:   Post radial cath instruction sheet given and reviewed w/ pt.

## 2014-03-03 DIAGNOSIS — I2510 Atherosclerotic heart disease of native coronary artery without angina pectoris: Secondary | ICD-10-CM | POA: Diagnosis present

## 2014-03-03 LAB — BASIC METABOLIC PANEL
BUN: 6 mg/dL (ref 6–23)
CO2: 24 mEq/L (ref 19–32)
Calcium: 9.5 mg/dL (ref 8.4–10.5)
Chloride: 105 mEq/L (ref 96–112)
Creatinine, Ser: 0.75 mg/dL (ref 0.50–1.10)
GFR calc Af Amer: >90 mL/min (ref >90)
GFR calc non Af Amer: 81 mL/min — ABNORMAL LOW (ref >90)
Glucose, Bld: 101 mg/dL — ABNORMAL HIGH (ref 70–99)
Potassium: 4 mEq/L (ref 3.7–5.3)
Sodium: 143 mEq/L (ref 137–147)

## 2014-03-03 LAB — CBC
HCT: 35.9 % — ABNORMAL LOW (ref 36.0–46.0)
Hemoglobin: 11.9 g/dL — ABNORMAL LOW (ref 12.0–15.0)
MCH: 31.2 pg (ref 26.0–34.0)
MCHC: 33.1 g/dL (ref 30.0–36.0)
MCV: 94 fL (ref 78.0–100.0)
Platelets: 261 10*3/uL (ref 150–400)
RBC: 3.82 MIL/uL — ABNORMAL LOW (ref 3.87–5.11)
RDW: 13 % (ref 11.5–15.5)
WBC: 11.2 10*3/uL — ABNORMAL HIGH (ref 4.0–10.5)

## 2014-03-03 MED ORDER — ROSUVASTATIN CALCIUM 10 MG PO TABS: 10.0000 mg | ORAL_TABLET | Freq: Every day | ORAL | Status: DC

## 2014-03-03 MED ORDER — ASPIRIN 81 MG PO TBEC: 81.0000 mg | DELAYED_RELEASE_TABLET | Freq: Every day | ORAL | Status: DC

## 2014-03-03 MED ORDER — METOPROLOL TARTRATE 12.5 MG HALF TABLET: 12.5000 mg | ORAL_TABLET | Freq: Two times a day (BID) | ORAL | Status: DC

## 2014-03-03 MED ORDER — ATORVASTATIN CALCIUM 40 MG PO TABS: 40.0000 mg | ORAL_TABLET | Freq: Every day | ORAL | Status: DC

## 2014-03-03 MED ORDER — TICAGRELOR 90 MG PO TABS: 90.0000 mg | ORAL_TABLET | Freq: Two times a day (BID) | ORAL | Status: DC

## 2014-03-03 MED ORDER — NITROGLYCERIN 0.4 MG SL SUBL: 0.4000 mg | SUBLINGUAL_TABLET | SUBLINGUAL | Status: AC | PRN

## 2014-03-03 NOTE — Discharge Summary (Signed)
See also rounding note.

## 2014-03-03 NOTE — Progress Notes (Signed)
    Subjective:  No chest pain this am  Objective:  Vital Signs in the last 24 hours: Temp:  [98 F (36.7 C)-99.8 F (37.7 C)] 98.4 F (36.9 C) (04/18 0738) Pulse Rate:  [54-74] 60 (04/18 0738) Resp:  [17-18] 18 (04/18 0738) BP: (127-225)/(56-95) 135/84 mmHg (04/18 0738) SpO2:  [93 %-99 %] 94 % (04/18 0738) Weight:  [155 lb 3.3 oz (70.4 kg)] 155 lb 3.3 oz (70.4 kg) (04/18 0003)  Intake/Output from previous day:  Intake/Output Summary (Last 24 hours) at 03/03/14 0836 Last data filed at 03/03/14 0410  Gross per 24 hour  Intake    430 ml  Output   2200 ml  Net  -1770 ml    Physical Exam: General appearance: alert, cooperative and no distress Lungs: clear to auscultation bilaterally Heart: regular rate and rhythm Extremities: Rt wrist with mild ecchymosis   Rate: 50-60  Rhythm: normal sinus rhythm and sinus bradycardia  Lab Results:  Recent Labs  03/02/14 0527 03/02/14 1434 03/03/14 0348  WBC 8.2  --  11.2*  HGB 10.3*  --  11.9*  PLT 245 259 261    Recent Labs  03/01/14 0450 03/03/14 0348  NA 137 143  K 3.6* 4.0  CL 94* 105  CO2 24 24  GLUCOSE 104* 101*  BUN 14 6  CREATININE 0.73 0.75    Recent Labs  03/01/14 0450 03/01/14 1050  TROPONINI 0.53* 1.57*    Recent Labs  03/01/14 0450  INR 0.97    Imaging: No results found.   Assessment/Plan:   Principal Problem:   NSTEMI (non-ST elevated myocardial infarction) Active Problems:   HTN (hypertension)   HLD (hyperlipidemia)   PLAN: Discharge, F/U in [place] with Dr Domenic Polite.  Kerin Ransom PA-C Beeper [phone] 03/03/2014, 8:36 AM   Attending note:  Patient seen and examined. Reviewed hospital course, agree with assessment by Mr. Reino Bellis. Ms. Bucker presents with chest pain and evidence of NSTEMI (troponin I 1.57), had symptoms with exertion. Cardiac catheterization from April 17 demonstrated moderate disease within the LAD, mild disease within the circumflex, and a tight thrombotic mid  RCA lesion that was treated with DES. She has ambulated this morning and did well. Plan will be discharge home on aspirin, Lipitor, Cozaar, Lopressor, and Brilinta. She will followup in [place] office in the next few weeks.  Satira Sark, M.D., F.A.C.C.

## 2014-03-03 NOTE — Discharge Instructions (Signed)
Radial Site Care Refer to this sheet in the next few weeks. These instructions provide you with information on caring for yourself after your procedure. Your caregiver may also give you more specific instructions. Your treatment has been planned according to current medical practices, but problems sometimes occur. Call your caregiver if you have any problems or questions after your procedure. HOME CARE INSTRUCTIONS  You may shower the day after the procedure.Remove the bandage (dressing) and gently wash the site with plain soap and water.Gently pat the site dry.  Do not apply powder or lotion to the site.  Do not submerge the affected site in water for 3 to 5 days.  Inspect the site at least twice daily.  Do not flex or bend the affected arm for 24 hours.  No lifting over 5 pounds (2.3 kg) for 5 days after your procedure.  Do not drive home if you are discharged the same day of the procedure. Have someone else drive you.  You may drive 24 hours after the procedure unless otherwise instructed by your caregiver.  Do not operate machinery or power tools for 24 hours.  A responsible adult should be with you for the first 24 hours after you arrive home. What to expect:  Any bruising will usually fade within 1 to 2 weeks.  Blood that collects in the tissue (hematoma) may be painful to the touch. It should usually decrease in size and tenderness within 1 to 2 weeks. SEEK IMMEDIATE MEDICAL CARE IF:  You have unusual pain at the radial site.  You have redness, warmth, swelling, or pain at the radial site.  You have drainage (other than a small amount of blood on the dressing).  You have chills.  You have a fever or persistent symptoms for more than 72 hours.  You have a fever and your symptoms suddenly get worse.  Your arm becomes pale, cool, tingly, or numb.  You have heavy bleeding from the site. Hold pressure on the site. Document Released: 12/05/2010 Document Revised:  01/25/2012 Document Reviewed: 12/05/2010 [facility] Patient Information 2014 [place], [place]. Coronary Angiography with Stent Coronary angiography with stent placement is a procedure to widen or open a narrow blood vessel of the heart (coronary artery). When a coronary artery becomes partially blocked, it decreases blood flow to that area. This may lead to chest pain or a heart attack (myocardial infarction). Arteries may become blocked by cholesterol buildup (plaque) in the lining or wall.  A stent is a small piece of metal that looks like a mesh or a spring. Stent placement may be done right after a coronary angiography in which a blocked artery is found or as a treatment for a heart attack.  LET [facility] CARE PROVIDER KNOW ABOUT:  Any allergies you have.   All medicines you are taking, including vitamins, herbs, eye drops, creams, and over-the-counter medicines.   Previous problems you or members of your family have had with the use of anesthetics.   Any blood disorders you have.   Previous surgeries you have had.   Medical conditions you have. RISKS AND COMPLICATIONS Generally, coronary angiography with stent is a safe procedure. However, as with any procedure, complications can occur. Possible complications include:   Damage to the heart or its blood vessels.   A return of blockage.   Bleeding at the site.   Blood clot in another part of the body.   Kidney injury.   Allergic reaction to the dye or contrast used.  BEFORE THE PROCEDURE  Do not eat or drink anything for 6 hours before the procedure.   Ask your health care provider if medicines can be taken with a sip of water.   Your health care provider will make sure you understand the procedure and the risks and potential complications associated with the procedure.  PROCEDURE  You may be given a medicine to help you relax before and during the procedure (sedative). This medicine will be given through an  IV tube that is put into one of your veins.   The area where the catheter will be inserted is shaved and cleaned. This is usually done in the groin but may be done in the fold of your arm (near your elbow) or in the wrist.   A medicine will be given to numb the area where the catheter will be inserted (local anesthetic).   The catheter is inserted into an artery using a guide wire. A type of X-ray (fluoroscopy) is used to help guide the catheter to the opening of the blocked artery.   A dye is then injected into the catheter, and X-rays are taken. The dye helps to show where any narrowing or blockages are located in the heart arteries.   A tiny wire is guided to the blocked spot, and a balloon is inflated to make the artery wider. The stent is expanded and crushes the plaque into the wall of the vessel. The stent holds the area open like a scaffolding and improves the blood flow.   Sometimes the artery may be made wider using a laser or other tools to remove plaque.   When the blood flow is better, the catheter is removed. The lining of the artery will grow over the stent, which stays where it was placed.  AFTER THE PROCEDURE  If the procedure is done through the leg, you will be kept in bed lying flat for about 6 hours. You will be instructed to not bend or cross your legs.   The insertion site will be checked frequently.   The pulse in your feet or wrist will be checked frequently.   Additional blood tests, X-rays, and electrocardiography may be done. Document Released: 05/09/2003 Document Revised: 08/23/2013 Document Reviewed: 05/11/2013 [facility] Patient Information 2014 [place].

## 2014-03-03 NOTE — Discharge Summary (Signed)
Patient ID: Jodi Spencer,  MRN: [id number], DOB/AGE: 1941/04/18 73 y.o.  Admit date: 02/28/2014 Discharge date: 03/03/2014  Primary Care Provider: Emerson Monte PA Primary Cardiologist: Dr Domenic Polite (new)  Discharge Diagnoses Principal Problem:   NSTEMI (non-ST elevated myocardial infarction) Active Problems:   CAD- RCA DES 03/02/14   HTN (hypertension)   HLD (hyperlipidemia)    Procedures: Cath/ PCI 03/02/14   Hospital Course: The patient is a 73 y/o female with a history of HTN and HLD (intolerant to statins) and no prior cardiac history who presented to [facility] today with a complaint of sudden onset left sided chest pain with radiation to the back and left upper extremity, while exercising in an aerobics class 02/28/14. She noted associated n/v and profuse diaphoresis. She left her aerobics class and went home, however her symptoms persisted, prompting her to report to the ER.  At [facility] her troponin was positive at 0.05 and she was transferred to [facility] for further care. She was given ASA and SL NTG and her pain improved. Here her Troponin peaked at 1.57. She underwent cath and subsequent RCA DES 03/02/14. She tolerated the procedure well. The pt has had problems with statin Rx in the past but agreed to try Crestor 10 mg. She lives in [place] and requested follow up there.    Discharge Vitals:  Blood pressure 135/84, pulse 60, temperature 98.4 F (36.9 C), temperature source Oral, resp. rate 18, height 5' (1.524 m), weight 155 lb 3.3 oz (70.4 kg), SpO2 94.00%.    Labs: Results for orders placed during the hospital encounter of 02/28/14 (from the past 48 hour(s))  TROPONIN I     Status: Abnormal   Collection Time    03/01/14 10:50 AM      Result Value Ref Range   Troponin I 1.57 (*) <0.30 ng/mL   Comment:            Due to the release kinetics of cTnI,     a negative result within the first hours     of the onset of symptoms does not rule out     myocardial  infarction with certainty.     If myocardial infarction is still suspected,     repeat the test at appropriate intervals.     CRITICAL VALUE NOTED.  VALUE IS CONSISTENT WITH PREVIOUSLY REPORTED AND CALLED VALUE.  HEPARIN LEVEL (UNFRACTIONATED)     Status: None   Collection Time    03/01/14 12:55 PM      Result Value Ref Range   Heparin Unfractionated 0.43  0.30 - 0.70 IU/mL   Comment:            IF HEPARIN RESULTS ARE BELOW     EXPECTED VALUES, AND PATIENT     DOSAGE HAS BEEN CONFIRMED,     SUGGEST FOLLOW UP TESTING     OF ANTITHROMBIN III LEVELS.  CBC     Status: Abnormal   Collection Time    03/02/14  5:27 AM      Result Value Ref Range   WBC 8.2  4.0 - 10.5 K/uL   RBC 3.26 (*) 3.87 - 5.11 MIL/uL   Hemoglobin 10.3 (*) 12.0 - 15.0 g/dL   Comment: REPEATED TO VERIFY     RESULTS VERIFIED VIA RECOLLECT   HCT 30.4 (*) 36.0 - 46.0 %   MCV 93.3  78.0 - 100.0 fL   MCH 31.6  26.0 - 34.0 pg   MCHC 33.9  30.0 -  36.0 g/dL   RDW 12.8  11.5 - 15.5 %   Platelets 245  150 - 400 K/uL   Comment: REPEATED TO VERIFY     DELTA CHECK NOTED  HEPARIN LEVEL (UNFRACTIONATED)     Status: None   Collection Time    03/02/14  5:27 AM      Result Value Ref Range   Heparin Unfractionated 0.30  0.30 - 0.70 IU/mL   Comment:            IF HEPARIN RESULTS ARE BELOW     EXPECTED VALUES, AND PATIENT     DOSAGE HAS BEEN CONFIRMED,     SUGGEST FOLLOW UP TESTING     OF ANTITHROMBIN III LEVELS.  POCT ACTIVATED CLOTTING TIME     Status: None   Collection Time    03/02/14  8:16 AM      Result Value Ref Range   Activated Clotting Time 254    POCT ACTIVATED CLOTTING TIME     Status: None   Collection Time    03/02/14  8:50 AM      Result Value Ref Range   Activated Clotting Time 298    PLATELET COUNT     Status: None   Collection Time    03/02/14  2:34 PM      Result Value Ref Range   Platelets 259  150 - 400 K/uL  CBC     Status: Abnormal   Collection Time    03/03/14  3:48 AM      Result Value Ref  Range   WBC 11.2 (*) 4.0 - 10.5 K/uL   RBC 3.82 (*) 3.87 - 5.11 MIL/uL   Hemoglobin 11.9 (*) 12.0 - 15.0 g/dL   HCT 35.9 (*) 36.0 - 46.0 %   MCV 94.0  78.0 - 100.0 fL   MCH 31.2  26.0 - 34.0 pg   MCHC 33.1  30.0 - 36.0 g/dL   RDW 13.0  11.5 - 15.5 %   Platelets 261  150 - 400 K/uL  BASIC METABOLIC PANEL     Status: Abnormal   Collection Time    03/03/14  3:48 AM      Result Value Ref Range   Sodium 143  137 - 147 mEq/L   Potassium 4.0  3.7 - 5.3 mEq/L   Chloride 105  96 - 112 mEq/L   CO2 24  19 - 32 mEq/L   Glucose, Bld 101 (*) 70 - 99 mg/dL   BUN 6  6 - 23 mg/dL   Creatinine, Ser 0.75  0.50 - 1.10 mg/dL   Calcium 9.5  8.4 - 10.5 mg/dL   GFR calc non Af Amer 81 (*) >90 mL/min   GFR calc Af Amer >90  >90 mL/min   Comment: (NOTE)     The eGFR has been calculated using the CKD EPI equation.     This calculation has not been validated in all clinical situations.     eGFR's persistently <90 mL/min signify possible Chronic Kidney     Disease.    Disposition:  Follow-up Information   Follow up with Rozann Lesches, MD. (office will call you)    Specialty:  Cardiology   Contact information:   [address] [place]  [address] [phone]       Discharge Medications:    Medication List    STOP taking these medications       RED YEAST RICE PO      TAKE these  medications       ALPRAZolam 0.5 MG tablet  Commonly known as:  XANAX  Take 0.25 mg by mouth at bedtime.     aspirin 81 MG EC tablet  Take 1 tablet (81 mg total) by mouth daily.     atorvastatin 40 MG tablet  Commonly known as:  LIPITOR  Take 1 tablet (40 mg total) by mouth daily at 6 PM.     FOCUSED MIND PO  Take 1 tablet by mouth daily.     losartan-hydrochlorothiazide 100-25 MG per tablet  Commonly known as:  HYZAAR  Take 1 tablet by mouth daily.     metoprolol tartrate 12.5 mg Tabs tablet  Commonly known as:  LOPRESSOR  Take 0.5 tablets (12.5 mg total) by mouth 2 (two) times daily.      neomycin-polymyxin-dexameth 0.1 % Oint  Commonly known as:  MAXITROL  Place 1 application into both eyes See admin instructions. Apply to eyes the first week of every month for 7 days.     nitroGLYCERIN 0.4 MG SL tablet  Commonly known as:  NITROSTAT  Place 1 tablet (0.4 mg total) under the tongue every 5 (five) minutes x 3 doses as needed for chest pain.     omeprazole 20 MG capsule  Commonly known as:  PRILOSEC  Take 20 mg by mouth daily.     ticagrelor 90 MG Tabs tablet  Commonly known as:  BRILINTA  Take 1 tablet (90 mg total) by mouth 2 (two) times daily.     TYLENOL ARTHRITIS PAIN PO  Take 1 tablet by mouth 2 (two) times daily as needed (pain).         Duration of Discharge Encounter: Greater than 30 minutes including physician time.  Angelena Form PA-C 03/03/2014 8:46 AM

## 2014-03-03 NOTE — Progress Notes (Signed)
CARDIAC REHAB PHASE I   PRE:  Rate/Rhythm: 58 SB  BP:  Supine: 135/84 Sitting:   Standing:    SaO2: 96% RA  MODE:  Ambulation: 600 ft   POST:  Rate/Rhythm: 93 SR 3 beats Vtach  BP:  Supine: 191/85 Sitting:   Standing:    SaO2: 95% RA  [id number] Patient tolerated ambulation well, independently, gait steady, no c/o. BP after ambulation was elevated at 191/85. MI/PCI education completed with patient including restrictions, risk factors, CP, NTG use and calling 911, heart healthy diet and exercise guidelines given. Pt verbalizes understanding of information given. Discussed Phase 2 cardiac rehab, and patient is interested in participating the cardiac rehab program at [facility], permission given to send contact information. Recheck BP after MI/PCI education completed was 165/74.  Sol Passer, MS, ACSM CCEP

## 2014-03-14 ENCOUNTER — Encounter: Payer: Self-pay | Admitting: Cardiology

## 2014-03-14 ENCOUNTER — Ambulatory Visit (INDEPENDENT_AMBULATORY_CARE_PROVIDER_SITE_OTHER): Payer: Medicare Other | Admitting: Cardiology

## 2014-03-14 VITALS — BP 170/94 | HR 57 | Ht 60.0 in | Wt 149.0 lb

## 2014-03-14 DIAGNOSIS — E785 Hyperlipidemia, unspecified: Secondary | ICD-10-CM

## 2014-03-14 DIAGNOSIS — I10 Essential (primary) hypertension: Secondary | ICD-10-CM

## 2014-03-14 DIAGNOSIS — I2510 Atherosclerotic heart disease of native coronary artery without angina pectoris: Secondary | ICD-10-CM

## 2014-03-14 MED ORDER — AMLODIPINE BESYLATE 5 MG PO TABS: 5.0000 mg | ORAL_TABLET | Freq: Every day | ORAL | Status: DC

## 2014-03-14 MED ORDER — TICAGRELOR 90 MG PO TABS: 90.0000 mg | ORAL_TABLET | Freq: Two times a day (BID) | ORAL | Status: DC

## 2014-03-14 NOTE — Progress Notes (Signed)
Clinical Summary Ms. Zwick is a 73 y.o.female seen today as a hospital follow up appointment. She is seen for the following medical problems.  1. NSTEMI/CAD - recent admit 02/28/14 with chest pain - troponin peaked at 1.57 - cath showed LM patent, LAD moderate prox to mid disease, mild LCX disease, 99% RCA leasion that received a DES.  - echo LVEF 50-55%  - denies any significant chest pain since discharge. - compliant with meds  2. HTN - does not check regularly at home - compliant with meds  3. Hyperlipidemia - reported prior statin intolerance (she is unsure which statin) described in the past, was tried on crestor 10mg  daily at discharge.  - tolerating crestor well  4. Tobacco - quit 16 years, smoked approx 50 years off and on.    Past Medical History  Diagnosis Date  . GERD (gastroesophageal reflux disease)   . Fibromyalgia      Allergies  Allergen Reactions  . Erythromycin Nausea And Vomiting  . Sulfa Antibiotics Other (See Comments)    Dry mouth   . Penicillins Itching and Rash     Current Outpatient Prescriptions  Medication Sig Dispense Refill  . Acetaminophen (TYLENOL ARTHRITIS PAIN PO) Take 1 tablet by mouth 2 (two) times daily as needed (pain).      Marland Kitchen ALPRAZolam (XANAX) 0.5 MG tablet Take 0.25 mg by mouth at bedtime.       Marland Kitchen aspirin EC 81 MG EC tablet Take 1 tablet (81 mg total) by mouth daily.      Marland Kitchen losartan-hydrochlorothiazide (HYZAAR) 100-25 MG per tablet Take 1 tablet by mouth daily.       . metoprolol tartrate (LOPRESSOR) 12.5 mg TABS tablet Take 0.5 tablets (12.5 mg total) by mouth 2 (two) times daily.  30 tablet  11  . Misc Natural Products (FOCUSED MIND PO) Take 1 tablet by mouth daily.      Marland Kitchen neomycin-polymyxin-dexameth (MAXITROL) 0.1 % OINT Place 1 application into both eyes See admin instructions. Apply to eyes the first week of every month for 7 days.      . nitroGLYCERIN (NITROSTAT) 0.4 MG SL tablet Place 1 tablet (0.4 mg total)  under the tongue every 5 (five) minutes x 3 doses as needed for chest pain.  25 tablet  2  . omeprazole (PRILOSEC) 20 MG capsule Take 20 mg by mouth daily.       . rosuvastatin (CRESTOR) 10 MG tablet Take 1 tablet (10 mg total) by mouth at bedtime.  30 tablet  11  . ticagrelor (BRILINTA) 90 MG TABS tablet Take 1 tablet (90 mg total) by mouth 2 (two) times daily.  60 tablet  11   No current facility-administered medications for this visit.     Past Surgical History  Procedure Laterality Date  . Breast surgery Right     lumpectomy     Allergies  Allergen Reactions  . Erythromycin Nausea And Vomiting  . Sulfa Antibiotics Other (See Comments)    Dry mouth   . Penicillins Itching and Rash      Family History  Problem Relation Age of Onset  . Coronary artery disease Sister 17    MI     Social History Ms. Lafont reports that she quit smoking about 15 years ago. Her smoking use included Cigarettes. She smoked 2.00 packs per day. She does not have any smokeless tobacco history on file. Ms. Bors reports that she drinks about 1.2 ounces of alcohol per week.  Review of Systems CONSTITUTIONAL: No weight loss, fever, chills, weakness or fatigue.  HEENT: Eyes: No visual loss, blurred vision, double vision or yellow sclerae.No hearing loss, sneezing, congestion, runny nose or sore throat.  SKIN: No rash or itching.  CARDIOVASCULAR: per HPI RESPIRATORY: Occas SOB  GASTROINTESTINAL: No anorexia, nausea, vomiting or diarrhea. No abdominal pain or blood.  GENITOURINARY: No burning on urination, no polyuria NEUROLOGICAL: No headache, dizziness, syncope, paralysis, ataxia, numbness or tingling in the extremities. No change in bowel or bladder control.  MUSCULOSKELETAL: No muscle, back pain, joint pain or stiffness.  LYMPHATICS: No enlarged nodes. No history of splenectomy.  PSYCHIATRIC: No history of depression or anxiety.  ENDOCRINOLOGIC: No reports of sweating, cold or heat  intolerance. No polyuria or polydipsia.  Marland Kitchen   Physical Examination p 57 bp 160/80 Wt 149 lbs BMI 29 Gen: resting comfortably, no acute distress HEENT: no scleral icterus, pupils equal round and reactive, no palptable cervical adenopathy,  CV: RRR, no m/r/g, no JVD, no carotid bruits Resp: Clear to auscultation bilaterally GI: abdomen is soft, non-tender, non-distended, normal bowel sounds, no hepatosplenomegaly MSK: extremities are warm, no edema.  Skin: warm, no rash Neuro:  no focal deficits Psych: appropriate affect   Diagnostic Studies 02/2014 Cath HEMODYNAMICS: Aortic pressure was 125/61; LV pressure was 127/4; LVEDP 12. There was no gradient between the left ventricle and aorta.  ANGIOGRAPHIC DATA: The left main coronary artery is widely patent.  The left anterior descending artery is a large vessel which reaches the apex. There is a moderate proximal to mid stenosis which is diffuse. The most severe portion appears to be 40-50%. The first diagonal is small and has a mild ostial stenosis. The second diagonal is large and widely patent.  The left circumflex artery is a large vessel proximally. There are 2 small OM vessels. The third OM is the largest and there is a mild stenosis at the bifurcation. There is a large ramus vessel.  The right coronary artery is a large dominant vessel. There is a moderate area of disease in the mid portion followed by a 99% thrombotic stenosis. The posterolateral artery and PDA are large and patent. The ostial PDA has a mild stenosis.  LEFT VENTRICULOGRAM: Left ventricular angiogram was not done in the 30 RAO projection. LVEDP was 12 mmHg.  PCI NARRATIVE: A JR 4 Guide catheter was used. IV heparin and tirofiban were used. ACT was used to check that the anticoagulation was therapeutic. A prowater wire was placed across the lesion. A 2.5 x 15 balloon was used to predilate. There was visible thrombus in the mid RCA post balloon. An aspiration catheter was  successfully used to remove the thrombus. A 3.0 x 23 Xience DES was deployed and postdilated with a 3.5 x 15 [place] balloon. There was an excellent angiographic result with no residual stenosis. IC NTG was used.  IMPRESSIONS:  1. Normal left main coronary artery. 2. Moderate proximal to mid disease in the left anterior descending artery. 3. Mild disease in the left circumflex artery and its branches. 4. 99% thrombotic lesion in the mid right coronary artery, successfully treated with a 3.0 x 23 Xience Drug eluting stent, postdilated to 3.5 mm in diameter. 5. Left ventricular systolic function not assessed. LVEDP 12 mmHg.  RECOMMENDATION: Continue dual antiplatelet therapy for at least a year. She will need aggressive secondary prevention. She will need cardiology f/u given her moderate LAD disease as well.   03/01/14 Echo Study Conclusions  - Left ventricle:  The cavity size was normal. Wall thickness was increased in a pattern of mild LVH. Systolic function was normal. The estimated ejection fraction was in the range of 50% to 55%. Wall motion was normal; there were no regional wall motion abnormalities. Doppler parameters are consistent with abnormal left ventricular relaxation (grade 1 diastolic dysfunction). - Right atrium: The atrium was mildly dilated.      Assessment and Plan   1. NSTEMI/CAD - s/p DES to RCA, continue DAPT at least until 02/2015  2. HTN - elevated, will start norvasc 5mg  daily  3. Hyperlipidemia - tolerating crestor well, continue current therapy  4. Tobacco - reports some occasional SOB at rest, she states she had PFTs sometime in the past at [facility]. Will request results      Arnoldo Lenis, M.D., F.A.C.C.

## 2014-03-14 NOTE — Patient Instructions (Signed)
Your physician recommends that you schedule a follow-up appointment in: 3 months with Dr. Harl Bowie. This appointment will be scheduled today before you leave.   Your physician has recommended you make the following change in your medication:  Start: Amlodipine 5 MG take 1 tablet by mouth once daily  Continue all other medications the same.

## 2014-03-15 ENCOUNTER — Encounter: Payer: Self-pay | Admitting: Cardiology

## 2014-03-20 ENCOUNTER — Other Ambulatory Visit: Payer: Self-pay | Admitting: *Deleted

## 2014-03-20 ENCOUNTER — Telehealth: Payer: Self-pay | Admitting: *Deleted

## 2014-03-20 MED ORDER — TICAGRELOR 90 MG PO TABS: 90.0000 mg | ORAL_TABLET | Freq: Two times a day (BID) | ORAL | Status: DC

## 2014-03-20 NOTE — Telephone Encounter (Signed)
She needs brillinta, did they not give her samples? If not someone please give her brillinta samples    [employer]

## 2014-03-20 NOTE — Telephone Encounter (Signed)
Samples supplied for patient by [facility] office

## 2014-03-20 NOTE — Telephone Encounter (Signed)
Samples requested from the [place] office. Patient will pick them up at [facility] office on Friday.

## 2014-04-04 DIAGNOSIS — I10 Essential (primary) hypertension: Secondary | ICD-10-CM | POA: Diagnosis not present

## 2014-05-04 ENCOUNTER — Encounter (HOSPITAL_COMMUNITY): Payer: Medicare Other

## 2014-05-04 ENCOUNTER — Ambulatory Visit (HOSPITAL_COMMUNITY): Payer: Medicare Other

## 2014-06-08 ENCOUNTER — Ambulatory Visit: Payer: Medicare Other | Admitting: Cardiology

## 2014-07-05 ENCOUNTER — Encounter: Payer: Self-pay | Admitting: Cardiology

## 2014-07-05 ENCOUNTER — Ambulatory Visit (INDEPENDENT_AMBULATORY_CARE_PROVIDER_SITE_OTHER): Payer: Medicare Other | Admitting: Cardiology

## 2014-07-05 VITALS — BP 134/82 | HR 51 | Ht 60.0 in | Wt 142.0 lb

## 2014-07-05 DIAGNOSIS — I10 Essential (primary) hypertension: Secondary | ICD-10-CM

## 2014-07-05 DIAGNOSIS — I2510 Atherosclerotic heart disease of native coronary artery without angina pectoris: Secondary | ICD-10-CM

## 2014-07-05 DIAGNOSIS — E785 Hyperlipidemia, unspecified: Secondary | ICD-10-CM

## 2014-07-05 MED ORDER — CLOPIDOGREL BISULFATE 75 MG PO TABS: 75.0000 mg | ORAL_TABLET | Freq: Every day | ORAL | Status: DC

## 2014-07-05 NOTE — Patient Instructions (Signed)
   Stop Brilinta  Start Plavix 75mg  daily. Sent to pharmacy Continue all other medications.   Your physician wants you to follow up in: 6 months.  You will receive a reminder letter in the mail one-two months in advance.  If you don't receive a letter, please call our office to schedule the follow up appointment

## 2014-07-05 NOTE — Progress Notes (Signed)
Clinical Summary Jodi Spencer is a 73 y.o.female seen today for follow up of the following medical problems.  1. NSTEMI/CAD  - recent admit 02/28/14 with chest pain, troponin peaked at 1.57  - cath showed LM patent, LAD moderate prox to mid disease, mild LCX disease, 99% RCA lesion that received a DES.  - echo LVEF 50-55%  - denies any significant chest pain since discharge.  - compliant with meds  - since starting brilinta reports increased SOB that has persistent.   2. HTN  - does not check regularly at home  - compliant with meds   3. Hyperlipidemia  - reported prior statin intolerance (she is unsure which statin) described in the past, was tried on crestor 10mg  daily at discharge.  - tolerating crestor well   4. COPD - PFTs from [facility] obtained from 2012, consistent with moderate COPD.  - reports previously being on treatment, she is not sure why no long on.  5. Sinus brady - sporadic dizziness, does not happen often  Past Medical History  Diagnosis Date  . GERD (gastroesophageal reflux disease)   . Fibromyalgia      Allergies  Allergen Reactions  . Erythromycin Nausea And Vomiting  . Sulfa Antibiotics Other (See Comments)    Dry mouth   . Penicillins Itching and Rash     Current Outpatient Prescriptions  Medication Sig Dispense Refill  . Acetaminophen (TYLENOL ARTHRITIS PAIN PO) Take 1 tablet by mouth 2 (two) times daily as needed (pain).      Marland Kitchen ALPRAZolam (XANAX) 0.5 MG tablet Take 0.25 mg by mouth at bedtime.       Marland Kitchen amLODipine (NORVASC) 5 MG tablet Take 1 tablet (5 mg total) by mouth daily.  30 tablet  6  . aspirin EC 81 MG EC tablet Take 1 tablet (81 mg total) by mouth daily.      Marland Kitchen losartan-hydrochlorothiazide (HYZAAR) 100-25 MG per tablet Take 1 tablet by mouth daily.       . metoprolol tartrate (LOPRESSOR) 12.5 mg TABS tablet Take 0.5 tablets (12.5 mg total) by mouth 2 (two) times daily.  30 tablet  11  . Misc Natural Products (FOCUSED MIND PO)  Take 1 tablet by mouth daily.      Marland Kitchen neomycin-polymyxin-dexameth (MAXITROL) 0.1 % OINT Place 1 application into both eyes See admin instructions. Apply to eyes the first week of every month for 7 days.      . nitroGLYCERIN (NITROSTAT) 0.4 MG SL tablet Place 1 tablet (0.4 mg total) under the tongue every 5 (five) minutes x 3 doses as needed for chest pain.  25 tablet  2  . omeprazole (PRILOSEC) 20 MG capsule Take 20 mg by mouth daily.       . rosuvastatin (CRESTOR) 10 MG tablet Take 1 tablet (10 mg total) by mouth at bedtime.  30 tablet  11  . ticagrelor (BRILINTA) 90 MG TABS tablet Take 1 tablet (90 mg total) by mouth 2 (two) times daily.  32 tablet  0   No current facility-administered medications for this visit.     Past Surgical History  Procedure Laterality Date  . Breast surgery Right     lumpectomy     Allergies  Allergen Reactions  . Erythromycin Nausea And Vomiting  . Sulfa Antibiotics Other (See Comments)    Dry mouth   . Penicillins Itching and Rash      Family History  Problem Relation Age of Onset  . Coronary artery  disease Sister 47    MI     Social History Ms. Momon reports that she quit smoking about 15 years ago. Her smoking use included Cigarettes. She smoked 2.00 packs per day. She does not have any smokeless tobacco history on file. Ms. Martine reports that she drinks about 1.2 ounces of alcohol per week.   Review of Systems CONSTITUTIONAL: No weight loss, fever, chills, weakness or fatigue.  HEENT: Eyes: No visual loss, blurred vision, double vision or yellow sclerae.No hearing loss, sneezing, congestion, runny nose or sore throat.  SKIN: No rash or itching.  CARDIOVASCULAR: per HPI RESPIRATORY: No shortness of breath, cough or sputum.  GASTROINTESTINAL: No anorexia, nausea, vomiting or diarrhea. No abdominal pain or blood.  GENITOURINARY: No burning on urination, no polyuria NEUROLOGICAL: occas dizziness MUSCULOSKELETAL: No muscle, back pain,  joint pain or stiffness.  LYMPHATICS: No enlarged nodes. No history of splenectomy.  PSYCHIATRIC: No history of depression or anxiety.  ENDOCRINOLOGIC: No reports of sweating, cold or heat intolerance. No polyuria or polydipsia.  Marland Kitchen   Physical Examination p 51 bp 134/82 Wt 142 lbs BMI 28 Gen: resting comfortably, no acute distress HEENT: no scleral icterus, pupils equal round and reactive, no palptable cervical adenopathy,  CV: RRR, no m/r/g, no JVD, no carotid bruits Resp: Clear to auscultation bilaterally GI: abdomen is soft, non-tender, non-distended, normal bowel sounds, no hepatosplenomegaly MSK: extremities are warm, no edema.  Skin: warm, no rash Neuro:  no focal deficits Psych: appropriate affect   Diagnostic Studies 02/2014 Cath  HEMODYNAMICS: Aortic pressure was 125/61; LV pressure was 127/4; LVEDP 12. There was no gradient between the left ventricle and aorta.  ANGIOGRAPHIC DATA: The left main coronary artery is widely patent.  The left anterior descending artery is a large vessel which reaches the apex. There is a moderate proximal to mid stenosis which is diffuse. The most severe portion appears to be 40-50%. The first diagonal is small and has a mild ostial stenosis. The second diagonal is large and widely patent.  The left circumflex artery is a large vessel proximally. There are 2 small OM vessels. The third OM is the largest and there is a mild stenosis at the bifurcation. There is a large ramus vessel.  The right coronary artery is a large dominant vessel. There is a moderate area of disease in the mid portion followed by a 99% thrombotic stenosis. The posterolateral artery and PDA are large and patent. The ostial PDA has a mild stenosis.  LEFT VENTRICULOGRAM: Left ventricular angiogram was not done in the 30 RAO projection. LVEDP was 12 mmHg.  PCI NARRATIVE: A JR 4 Guide catheter was used. IV heparin and tirofiban were used. ACT was used to check that the anticoagulation  was therapeutic. A prowater wire was placed across the lesion. A 2.5 x 15 balloon was used to predilate. There was visible thrombus in the mid RCA post balloon. An aspiration catheter was successfully used to remove the thrombus. A 3.0 x 23 Xience DES was deployed and postdilated with a 3.5 x 15 [place] balloon. There was an excellent angiographic result with no residual stenosis. IC NTG was used.  IMPRESSIONS:  1. Normal left main coronary artery. 2. Moderate proximal to mid disease in the left anterior descending artery. 3. Mild disease in the left circumflex artery and its branches. 4. 99% thrombotic lesion in the mid right coronary artery, successfully treated with a 3.0 x 23 Xience Drug eluting stent, postdilated to 3.5 mm in diameter. 5.  Left ventricular systolic function not assessed. LVEDP 12 mmHg.  RECOMMENDATION: Continue dual antiplatelet therapy for at least a year. She will need aggressive secondary prevention. She will need cardiology f/u given her moderate LAD disease as well.   03/01/14 Echo  Study Conclusions  - Left ventricle: The cavity size was normal. Wall thickness was increased in a pattern of mild LVH. Systolic function was normal. The estimated ejection fraction was in the range of 50% to 55%. Wall motion was normal; there were no regional wall motion abnormalities. Doppler parameters are consistent with abnormal left ventricular relaxation (grade 1 diastolic dysfunction). - Right atrium: The atrium was mildly dilated.   12/2010 PFTs Moderate COPD     Assessment and Plan  1. NSTEMI/CAD  - s/p DES to RCA, continue DAPT at least until 02/2015  - reports increased SOB since staring brilinta, will change to plavix 75mg  daily  2. HTN  - at goal, continue current meds   3. Hyperlipidemia  - tolerating crestor well, continue current therapy   4. COPD - she is to discuss with pcp, suspect her SOB is related   F/u 6 months    Arnoldo Lenis, M.D.,  F.A.C.C.

## 2014-10-05 ENCOUNTER — Other Ambulatory Visit: Payer: Self-pay | Admitting: *Deleted

## 2014-10-05 MED ORDER — AMLODIPINE BESYLATE 5 MG PO TABS: 5.0000 mg | ORAL_TABLET | Freq: Every day | ORAL | Status: DC

## 2014-10-25 ENCOUNTER — Encounter (HOSPITAL_COMMUNITY): Payer: Self-pay | Admitting: Interventional Cardiology

## 2014-11-29 ENCOUNTER — Encounter (HOSPITAL_COMMUNITY): Payer: Self-pay | Admitting: Cardiovascular Disease

## 2014-12-18 ENCOUNTER — Ambulatory Visit (INDEPENDENT_AMBULATORY_CARE_PROVIDER_SITE_OTHER): Payer: Medicare Other | Admitting: Cardiology

## 2014-12-18 ENCOUNTER — Encounter: Payer: Self-pay | Admitting: Cardiology

## 2014-12-18 VITALS — BP 124/76 | HR 50 | Ht 60.0 in | Wt 138.0 lb

## 2014-12-18 DIAGNOSIS — I2510 Atherosclerotic heart disease of native coronary artery without angina pectoris: Secondary | ICD-10-CM

## 2014-12-18 DIAGNOSIS — J449 Chronic obstructive pulmonary disease, unspecified: Secondary | ICD-10-CM

## 2014-12-18 DIAGNOSIS — I10 Essential (primary) hypertension: Secondary | ICD-10-CM

## 2014-12-18 DIAGNOSIS — E785 Hyperlipidemia, unspecified: Secondary | ICD-10-CM

## 2014-12-18 MED ORDER — PRAVASTATIN SODIUM 20 MG PO TABS: 20.0000 mg | ORAL_TABLET | Freq: Every evening | ORAL | Status: DC

## 2014-12-18 MED ORDER — LOSARTAN POTASSIUM-HCTZ 100-12.5 MG PO TABS: 1.0000 | ORAL_TABLET | Freq: Every day | ORAL | Status: DC

## 2014-12-18 NOTE — Progress Notes (Signed)
Clinical Summary Ms. Dulski is a 74 y.o.female seen today for follow up of the following medical problem.s   1. CAD  - NSTEMI 02/28/14, troponin peaked at 1.57  - cath showed LM patent, LAD moderate prox to mid disease, mild LCX disease, 99% RCA lesion that received a DES.  - echo LVEF 50-55%   - no significant chest pain. Mild SOB at times. Occas LE edema.  - compliant with meds  - since starting brilinta reports increased SOB. Last visit we changed her to plavix. Breathing much improved.   2. HTN  - does not check regularly at home  - compliant with meds   3. Hyperlipidemia  - reported prior statin intolerance (she is unsure which statin) described in the past, was tried on crestor 10mg  daily at discharge.  - tolerating crestor well but too expensive.   4. COPD - PFTs from [facility] obtained from 2012, consistent with moderate COPD.  - reports previously being on treatment, she is not sure why no longer on.  5. Sinus brady - sporadic dizziness, does not happen often - dizziness mainly with standing.   Past Medical History  Diagnosis Date  . GERD (gastroesophageal reflux disease)   . Fibromyalgia      Allergies  Allergen Reactions  . Erythromycin Nausea And Vomiting  . Nsaids Nausea Only  . Sulfa Antibiotics Other (See Comments)    Dry mouth   . Penicillins Itching and Rash     Current Outpatient Prescriptions  Medication Sig Dispense Refill  . Acetaminophen (TYLENOL ARTHRITIS PAIN PO) Take 1 tablet by mouth 2 (two) times daily as needed (pain). Two at night with One Xanax    . ALPRAZolam (XANAX) 0.5 MG tablet Take 0.25 mg by mouth at bedtime.     Marland Kitchen amLODipine (NORVASC) 5 MG tablet Take 1 tablet (5 mg total) by mouth daily. 90 tablet 3  . aspirin 81 MG tablet Take 81 mg by mouth daily.    . clopidogrel (PLAVIX) 75 MG tablet Take 1 tablet (75 mg total) by mouth daily. 30 tablet 5  . losartan-hydrochlorothiazide (HYZAAR) 100-25 MG per tablet Take 1  tablet by mouth daily.     . metoprolol tartrate (LOPRESSOR) 12.5 mg TABS tablet Take 0.5 tablets (12.5 mg total) by mouth 2 (two) times daily. 30 tablet 11  . Misc Natural Products (FOCUSED MIND PO) Take 1 tablet by mouth daily.    Marland Kitchen neomycin-polymyxin-dexameth (MAXITROL) 0.1 % OINT Place 1 application into both eyes See admin instructions. Apply to eyes the first week of every month for 7 days.    . nitroGLYCERIN (NITROSTAT) 0.4 MG SL tablet Place 1 tablet (0.4 mg total) under the tongue every 5 (five) minutes x 3 doses as needed for chest pain. 25 tablet 2  . omeprazole (PRILOSEC) 20 MG capsule Take 20 mg by mouth daily.     . rosuvastatin (CRESTOR) 10 MG tablet Take 1 tablet (10 mg total) by mouth at bedtime. 30 tablet 11   No current facility-administered medications for this visit.     Past Surgical History  Procedure Laterality Date  . Breast surgery Right     lumpectomy  . Left heart catheterization with coronary angiogram N/A 03/02/2014    Procedure: LEFT HEART CATHETERIZATION WITH CORONARY ANGIOGRAM;  Surgeon: Jettie Booze, MD;  Location: [facility] CATH LAB;  Service: Cardiovascular;  Laterality: N/A;  . Percutaneous coronary stent intervention (pci-s)  03/02/2014    Procedure: PERCUTANEOUS CORONARY STENT INTERVENTION (  PCI-S);  Surgeon: Jettie Booze, MD;  Location: [facility] CATH LAB;  Service: Cardiovascular;;     Allergies  Allergen Reactions  . Erythromycin Nausea And Vomiting  . Nsaids Nausea Only  . Sulfa Antibiotics Other (See Comments)    Dry mouth   . Penicillins Itching and Rash      Family History  Problem Relation Age of Onset  . Coronary artery disease Sister 53    MI     Social History Ms. Langlinais reports that she quit smoking about 16 years ago. Her smoking use included Cigarettes. She smoked 2.00 packs per day. She does not have any smokeless tobacco history on file. Ms. Bigler reports that she drinks about 1.2 oz of alcohol per week.   Review of  Systems CONSTITUTIONAL: No weight loss, fever, chills, weakness or fatigue.  HEENT: Eyes: No visual loss, blurred vision, double vision or yellow sclerae.No hearing loss, sneezing, congestion, runny nose or sore throat.  SKIN: No rash or itching.  CARDIOVASCULAR: per HPI RESPIRATORY: No cough or sputum.  GASTROINTESTINAL: No anorexia, nausea, vomiting or diarrhea. No abdominal pain or blood.  GENITOURINARY: No burning on urination, no polyuria NEUROLOGICAL: occas dizziness  MUSCULOSKELETAL: No muscle, back pain, joint pain or stiffness.  LYMPHATICS: No enlarged nodes. No history of splenectomy.  PSYCHIATRIC: No history of depression or anxiety.  ENDOCRINOLOGIC: No reports of sweating, cold or heat intolerance. No polyuria or polydipsia.  Marland Kitchen   Physical Examination p 50 bp 124/76 Wt 138 lbs BMI 27 Gen: resting comfortably, no acute distress HEENT: no scleral icterus, pupils equal round and reactive, no palptable cervical adenopathy,  CV: RRR, no m/r/g, no JVD, no carotid bruits Resp: Clear to auscultation bilaterally GI: abdomen is soft, non-tender, non-distended, normal bowel sounds, no hepatosplenomegaly MSK: extremities are warm, no edema.  Skin: warm, no rash Neuro:  no focal deficits Psych: appropriate affect   Diagnostic Studies  02/2014 Cath  HEMODYNAMICS: Aortic pressure was 125/61; LV pressure was 127/4; LVEDP 12. There was no gradient between the left ventricle and aorta.  ANGIOGRAPHIC DATA: The left main coronary artery is widely patent.  The left anterior descending artery is a large vessel which reaches the apex. There is a moderate proximal to mid stenosis which is diffuse. The most severe portion appears to be 40-50%. The first diagonal is small and has a mild ostial stenosis. The second diagonal is large and widely patent.  The left circumflex artery is a large vessel proximally. There are 2 small OM vessels. The third OM is the largest and there is a mild stenosis  at the bifurcation. There is a large ramus vessel.  The right coronary artery is a large dominant vessel. There is a moderate area of disease in the mid portion followed by a 99% thrombotic stenosis. The posterolateral artery and PDA are large and patent. The ostial PDA has a mild stenosis.  LEFT VENTRICULOGRAM: Left ventricular angiogram was not done in the 30 RAO projection. LVEDP was 12 mmHg.  PCI NARRATIVE: A JR 4 Guide catheter was used. IV heparin and tirofiban were used. ACT was used to check that the anticoagulation was therapeutic. A prowater wire was placed across the lesion. A 2.5 x 15 balloon was used to predilate. There was visible thrombus in the mid RCA post balloon. An aspiration catheter was successfully used to remove the thrombus. A 3.0 x 23 Xience DES was deployed and postdilated with a 3.5 x 15 [place] balloon. There was an excellent angiographic  result with no residual stenosis. IC NTG was used.  IMPRESSIONS:  1. Normal left main coronary artery. 2. Moderate proximal to mid disease in the left anterior descending artery. 3. Mild disease in the left circumflex artery and its branches. 4. 99% thrombotic lesion in the mid right coronary artery, successfully treated with a 3.0 x 23 Xience Drug eluting stent, postdilated to 3.5 mm in diameter. 5. Left ventricular systolic function not assessed. LVEDP 12 mmHg. RECOMMENDATION: Continue dual antiplatelet therapy for at least a year. She will need aggressive secondary prevention. She will need cardiology f/u given her moderate LAD disease as well.   03/01/14 Echo  Study Conclusions  - Left ventricle: The cavity size was normal. Wall thickness was increased in a pattern of mild LVH. Systolic function was normal. The estimated ejection fraction was in the range of 50% to 55%. Wall motion was normal; there were no regional wall motion abnormalities. Doppler parameters are consistent with abnormal left ventricular relaxation (grade 1  diastolic dysfunction). - Right atrium: The atrium was mildly dilated.   12/2010 PFTs Moderate COPD    Assessment and Plan  1. CAD  - s/p DES to RCA, continue DAPT at least until 02/2015. Instructed to stop plavix in April. - no current symptoms, continue current meds  2. HTN  - at goal, continue current meds   3. Hyperlipidemia  - crestor is too expensive for her. She reports muscle aches on other statins though she is not sure which ones. WIll try changing to prava 20mg  daily.   4. COPD - per pcp  5. Sinus bradycardia - mild dizziness at times, unclear if related. Given recent MI would like to try to continue beta blocker. Will decrease her hyzaar to 100/12.5 and follow symptoms     F/u 4 months    Arnoldo Lenis, M.D.

## 2014-12-18 NOTE — Patient Instructions (Signed)
Your physician recommends that you schedule a follow-up appointment in: [place] [address] [place]   Your physician has recommended you make the following change in your medication:   DECREASE HYZAR TO 100-12.5 MG DAILY  STOP CRESTOR  START PRAVASTATIN 20 MG DAILY  YOU MAY STOP PLAVIX April 16TH 2016  Thank you for choosing [facility]!!

## 2015-01-07 ENCOUNTER — Telehealth: Payer: Self-pay | Admitting: *Deleted

## 2015-01-07 ENCOUNTER — Other Ambulatory Visit: Payer: Self-pay | Admitting: *Deleted

## 2015-01-07 MED ORDER — CLOPIDOGREL BISULFATE 75 MG PO TABS: 75.0000 mg | ORAL_TABLET | Freq: Every day | ORAL | Status: DC

## 2015-01-07 NOTE — Telephone Encounter (Signed)
Plavix refilled today.  Will stop in April.  Patient aware.

## 2015-01-07 NOTE — Telephone Encounter (Signed)
Patient states that she can not tolerate the Pravastatin.  Muscle aches & made her hurt all over.  Stopped 3 days ago.  States that she is now feeling better.  Patient informed that message will be sent to provider.

## 2015-03-02 DIAGNOSIS — M542 Cervicalgia: Secondary | ICD-10-CM | POA: Diagnosis not present

## 2015-03-02 DIAGNOSIS — R109 Unspecified abdominal pain: Secondary | ICD-10-CM | POA: Diagnosis not present

## 2015-03-02 DIAGNOSIS — S161XXA Strain of muscle, fascia and tendon at neck level, initial encounter: Secondary | ICD-10-CM | POA: Diagnosis not present

## 2015-03-02 DIAGNOSIS — R079 Chest pain, unspecified: Secondary | ICD-10-CM | POA: Diagnosis not present

## 2015-03-07 ENCOUNTER — Telehealth: Payer: Self-pay | Admitting: Cardiology

## 2015-03-07 NOTE — Telephone Encounter (Signed)
Left message to return call 

## 2015-03-07 NOTE — Telephone Encounter (Signed)
Patient has questions about her metoprolol tartrate (LOPRESSOR) 12.5 mg TABS tablet

## 2015-03-11 MED ORDER — METOPROLOL TARTRATE 25 MG PO TABS: 12.5000 mg | ORAL_TABLET | Freq: Two times a day (BID) | ORAL | Status: DC

## 2015-03-11 NOTE — Telephone Encounter (Signed)
Refill sent to pharm for 90 day at patient request.

## 2015-03-13 DIAGNOSIS — M542 Cervicalgia: Secondary | ICD-10-CM | POA: Diagnosis not present

## 2015-03-20 ENCOUNTER — Encounter: Payer: Self-pay | Admitting: *Deleted

## 2015-03-20 ENCOUNTER — Encounter: Payer: Self-pay | Admitting: Cardiology

## 2015-03-20 ENCOUNTER — Ambulatory Visit (INDEPENDENT_AMBULATORY_CARE_PROVIDER_SITE_OTHER): Payer: Medicare Other | Admitting: Cardiology

## 2015-03-20 VITALS — BP 152/80 | HR 54 | Ht 60.0 in | Wt 138.0 lb

## 2015-03-20 DIAGNOSIS — I2510 Atherosclerotic heart disease of native coronary artery without angina pectoris: Secondary | ICD-10-CM

## 2015-03-20 DIAGNOSIS — I10 Essential (primary) hypertension: Secondary | ICD-10-CM | POA: Diagnosis not present

## 2015-03-20 DIAGNOSIS — E785 Hyperlipidemia, unspecified: Secondary | ICD-10-CM

## 2015-03-20 NOTE — Patient Instructions (Signed)
Your physician wants you to follow-up in: [place] [address] [place] You will receive a reminder letter in the mail two months in advance. If you don't receive a letter, please call our office to schedule the follow-up appointment.  Your physician recommends that you continue on your current medications as directed. Please refer to the Current Medication list given to you today.  WE WILL REQUEST LABS FROM YOUR PRIMARY DR  Your physician has requested that you regularly monitor and record your blood pressure readings at home FOR 2 [place]. Please use the same machine at the same time of day to check your readings and record them to bring to your follow-up visit.  Thank you for choosing [facility]!!
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
## 2015-04-08 ENCOUNTER — Telehealth: Payer: Self-pay | Admitting: *Deleted

## 2015-04-08 NOTE — Telephone Encounter (Signed)
F/u with pt per LOV BP as follows: Will forward to Dr. Harl Bowie  90/60  HR 49   130/84  48 130/70  74 132/84  51 110/70  52

## 2015-04-09 NOTE — Telephone Encounter (Signed)
Overall numbers are ok, occasional mildly low heart rates and blood pressure. Are there any symptoms?   Zandra Abts MD

## 2015-04-09 NOTE — Telephone Encounter (Signed)
Pt denied any symptoms

## 2015-04-18 ENCOUNTER — Ambulatory Visit: Payer: Medicare Other | Admitting: Cardiology

## 2015-05-13 DIAGNOSIS — H538 Other visual disturbances: Secondary | ICD-10-CM | POA: Diagnosis not present

## 2015-08-26 DIAGNOSIS — H538 Other visual disturbances: Secondary | ICD-10-CM | POA: Diagnosis not present

## 2015-09-09 ENCOUNTER — Ambulatory Visit (INDEPENDENT_AMBULATORY_CARE_PROVIDER_SITE_OTHER): Payer: Medicare Other | Admitting: Cardiology

## 2015-09-09 ENCOUNTER — Encounter: Payer: Self-pay | Admitting: *Deleted

## 2015-09-09 ENCOUNTER — Encounter: Payer: Self-pay | Admitting: Cardiology

## 2015-09-09 VITALS — BP 121/74 | HR 51 | Ht 62.0 in | Wt 142.0 lb

## 2015-09-09 DIAGNOSIS — E785 Hyperlipidemia, unspecified: Secondary | ICD-10-CM | POA: Diagnosis not present

## 2015-09-09 DIAGNOSIS — I10 Essential (primary) hypertension: Secondary | ICD-10-CM

## 2015-09-09 DIAGNOSIS — R079 Chest pain, unspecified: Secondary | ICD-10-CM | POA: Diagnosis not present

## 2015-09-09 DIAGNOSIS — I2510 Atherosclerotic heart disease of native coronary artery without angina pectoris: Secondary | ICD-10-CM | POA: Diagnosis not present

## 2015-09-09 MED ORDER — EZETIMIBE 10 MG PO TABS: 10.0000 mg | ORAL_TABLET | Freq: Every day | ORAL | Status: DC

## 2015-09-09 MED ORDER — AMLODIPINE BESYLATE 5 MG PO TABS: 7.5000 mg | ORAL_TABLET | Freq: Every day | ORAL | Status: DC

## 2015-09-09 NOTE — Patient Instructions (Signed)
Your physician wants you to follow-up in: [place] DR[address] [place] You will receive a reminder letter in the mail two months in advance. If you don't receive a letter, please call our office to schedule the follow-up appointment.  Your physician has recommended you make the following change in your medication:   INCREASE AMLODIPINE 7.5 MG DAILY (1& 1/2 TAB DAILY)  START ZETIA 10 MG FOR 1 WEEK AND CALL OUR OFFICE  Your physician has requested that you have en exercise stress myoview. For further information please visit [email or web address]. Please follow instruction sheet, as given.  Thank you for choosing [facility]!!
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
## 2015-09-20 ENCOUNTER — Telehealth: Payer: Self-pay | Admitting: *Deleted

## 2015-09-20 NOTE — Telephone Encounter (Signed)
F/u with pt on Zetia and pt says would cost $50 for 7 pills and would not be trying this medication. Will forward to Dr. Harl Bowie as Juluis Rainier

## 2015-09-20 NOTE — Telephone Encounter (Signed)
Ok thanks for update. Due to cost Im ok with her not being on it  Zandra Abts MD

## 2015-10-15 ENCOUNTER — Encounter (HOSPITAL_COMMUNITY): Payer: Medicare Other

## 2015-10-22 ENCOUNTER — Inpatient Hospital Stay (HOSPITAL_COMMUNITY): Admission: RE | Admit: 2015-10-22 | Payer: Medicare Other | Source: Ambulatory Visit

## 2015-10-22 ENCOUNTER — Encounter (HOSPITAL_COMMUNITY)
Admission: RE | Admit: 2015-10-22 | Discharge: 2015-10-22 | Disposition: A | Discharge status: Home / Self Care | Payer: Medicare Other | Source: Ambulatory Visit | Attending: Cardiology | Admitting: Cardiology

## 2015-10-22 ENCOUNTER — Encounter (HOSPITAL_COMMUNITY): Payer: Self-pay

## 2015-10-22 DIAGNOSIS — R079 Chest pain, unspecified: Secondary | ICD-10-CM | POA: Diagnosis not present

## 2015-10-22 LAB — NM MYOCAR MULTI W/SPECT W/WALL MOTION / EF
Estimated workload: 7.8 METS
Exercise duration (min): 6 min
Exercise duration (sec): 17 s
LV dias vol: 45 mL
LV sys vol: 15 mL
RATE: 0.41
RPE: 17
Rest HR: 52 {beats}/min
SDS: 2
SRS: 4
SSS: 6
TID: 0.86

## 2015-10-22 MED ORDER — REGADENOSON 0.4 MG/5ML IV SOLN
INTRAVENOUS | Status: AC
  Administered 2015-10-22: 0.4 mg via INTRAVENOUS
  Filled 2015-10-22: qty 5

## 2015-10-22 MED ORDER — SODIUM CHLORIDE 0.9 % IJ SOLN
INTRAMUSCULAR | Status: AC
  Administered 2015-10-22: 10 mL via INTRAVENOUS
  Filled 2015-10-22: qty 3

## 2015-10-22 MED ORDER — TECHNETIUM TC 99M SESTAMIBI - CARDIOLITE
30.0000 | Freq: Once | INTRAVENOUS | Status: AC | PRN
  Administered 2015-10-22: 30 via INTRAVENOUS

## 2015-10-22 MED ORDER — TECHNETIUM TC 99M SESTAMIBI GENERIC - CARDIOLITE
10.0000 | Freq: Once | INTRAVENOUS | Status: AC | PRN
  Administered 2015-10-22: 10 via INTRAVENOUS

## 2015-10-23 ENCOUNTER — Telehealth: Payer: Self-pay | Admitting: *Deleted

## 2015-10-23 NOTE — Telephone Encounter (Signed)
-----   Message from Arnoldo Lenis, MD sent at 10/23/2015 10:30 AM EST ----- Stress test looks good, no evidence of any new blockages. She needs to f/u with her pcp to discuss noncardiac causes of chest pain. Keep regular f/u with me  Zandra Abts MD

## 2015-10-23 NOTE — Telephone Encounter (Signed)
Pt aware, routed to pcp, recall placed for f/u with Dr Harl Bowie

## 2015-12-23 ENCOUNTER — Other Ambulatory Visit: Payer: Self-pay | Admitting: Cardiology

## 2016-01-27 DIAGNOSIS — H353122 Nonexudative age-related macular degeneration, left eye, intermediate dry stage: Secondary | ICD-10-CM | POA: Diagnosis not present

## 2016-01-27 DIAGNOSIS — H353211 Exudative age-related macular degeneration, right eye, with active choroidal neovascularization: Secondary | ICD-10-CM | POA: Diagnosis not present

## 2016-01-30 DIAGNOSIS — I10 Essential (primary) hypertension: Secondary | ICD-10-CM | POA: Diagnosis not present

## 2016-01-30 DIAGNOSIS — E782 Mixed hyperlipidemia: Secondary | ICD-10-CM | POA: Diagnosis not present

## 2016-01-30 DIAGNOSIS — K219 Gastro-esophageal reflux disease without esophagitis: Secondary | ICD-10-CM | POA: Diagnosis not present

## 2016-01-30 DIAGNOSIS — R5383 Other fatigue: Secondary | ICD-10-CM | POA: Diagnosis not present

## 2016-02-06 DIAGNOSIS — I10 Essential (primary) hypertension: Secondary | ICD-10-CM | POA: Diagnosis not present

## 2016-02-06 DIAGNOSIS — S2220XA Unspecified fracture of sternum, initial encounter for closed fracture: Secondary | ICD-10-CM | POA: Diagnosis not present

## 2016-02-06 DIAGNOSIS — M542 Cervicalgia: Secondary | ICD-10-CM | POA: Diagnosis not present

## 2016-02-06 DIAGNOSIS — M545 Low back pain: Secondary | ICD-10-CM | POA: Diagnosis not present

## 2016-03-05 DIAGNOSIS — H353211 Exudative age-related macular degeneration, right eye, with active choroidal neovascularization: Secondary | ICD-10-CM | POA: Diagnosis not present

## 2016-03-17 ENCOUNTER — Other Ambulatory Visit: Payer: Self-pay | Admitting: Cardiology

## 2016-04-08 DIAGNOSIS — Z961 Presence of intraocular lens: Secondary | ICD-10-CM | POA: Diagnosis not present

## 2016-04-28 ENCOUNTER — Ambulatory Visit (INDEPENDENT_AMBULATORY_CARE_PROVIDER_SITE_OTHER): Payer: Medicare Other | Admitting: Cardiology

## 2016-04-28 ENCOUNTER — Encounter: Payer: Self-pay | Admitting: Cardiology

## 2016-04-28 VITALS — BP 133/79 | HR 56 | Ht 62.0 in | Wt 143.0 lb

## 2016-04-28 DIAGNOSIS — I10 Essential (primary) hypertension: Secondary | ICD-10-CM

## 2016-04-28 DIAGNOSIS — R079 Chest pain, unspecified: Secondary | ICD-10-CM | POA: Diagnosis not present

## 2016-04-28 DIAGNOSIS — E785 Hyperlipidemia, unspecified: Secondary | ICD-10-CM | POA: Diagnosis not present

## 2016-04-28 DIAGNOSIS — I2510 Atherosclerotic heart disease of native coronary artery without angina pectoris: Secondary | ICD-10-CM

## 2016-04-28 MED ORDER — AMLODIPINE BESYLATE 5 MG PO TABS: 5.0000 mg | ORAL_TABLET | Freq: Every day | ORAL | Status: DC

## 2016-04-28 MED ORDER — LOSARTAN POTASSIUM-HCTZ 100-12.5 MG PO TABS: 1.0000 | ORAL_TABLET | Freq: Every day | ORAL | Status: DC

## 2016-04-28 MED ORDER — METOPROLOL TARTRATE 25 MG PO TABS: 12.5000 mg | ORAL_TABLET | Freq: Two times a day (BID) | ORAL | Status: DC

## 2016-04-28 NOTE — Patient Instructions (Signed)
Your physician recommends that you schedule a follow-up appointment in: [place] DR. Colon  Your physician has recommended you make the following change in your medication:   TAKE AMLODIPINE 5 MG DAILY  Thank you for choosing [facility]!!

## 2016-04-28 NOTE — Progress Notes (Signed)
Patient ID: MACAIAH WALSH, female   DOB: 1941-08-09, 75 y.o.   MRN: [id number]     Clinical Summary Ms. Topf is a 75 y.o.female seen today for follow up of the following medical problems.   1. CAD  - NSTEMI 02/28/14, troponin peaked at 1.57  - cath showed LM patent, LAD moderate prox to mid disease, mild LCX disease, 99% RCA lesion that received a DES.  - echo LVEF 50-55%  - 10/2015 nuclear stress test without evidence for ischemia  - still with chest pain since last visit. Episode 2 weeks ago. Occurred while rolling up a carpet at home. Heaviness midchest, 2-3/10. Felt shaky, diaphoretic. +SOB, felt nauseous. Heaviness lasted for 15-20 minutes. Took NG with some benefit. She reports similar episode prior to her heart attack 02/2014. Still with DOE that is unchanged.    2. HTN  - has been off hyzaar and lopressor for a few months.   3. Hyperlipidemia  - she has been intolerant to multiple statins, most recently pravastatin. - last visit we tried starting zetia. Zetia caused muscle aches and she stopped taking.    Past Medical History  Diagnosis Date  . GERD (gastroesophageal reflux disease)   . Fibromyalgia      Allergies  Allergen Reactions  . Erythromycin Nausea And Vomiting  . Nsaids Nausea Only  . Sulfa Antibiotics Other (See Comments)    Dry mouth   . Penicillins Itching and Rash     Current Outpatient Prescriptions  Medication Sig Dispense Refill  . Acetaminophen (TYLENOL ARTHRITIS PAIN PO) Take 2 tablets by mouth at bedtime.     . ALPRAZolam (XANAX) 0.5 MG tablet Take 0.5 mg by mouth 2 (two) times daily as needed (takes one at bedtime every night).     Marland Kitchen amLODipine (NORVASC) 5 MG tablet Take 1.5 tablets (7.5 mg total) by mouth daily. 180 tablet 3  . aspirin 81 MG tablet Take 81 mg by mouth daily.    Marland Kitchen ezetimibe (ZETIA) 10 MG tablet Take 1 tablet (10 mg total) by mouth daily. 7 tablet 0  . gabapentin (NEURONTIN) 100 MG capsule Take 1 capsule by mouth 2  (two) times daily.    Marland Kitchen HYDROcodone-acetaminophen (NORCO/VICODIN) 5-325 MG tablet Take 1 tablet by mouth as needed for moderate pain.    Marland Kitchen losartan-hydrochlorothiazide (HYZAAR) 100-12.5 MG tablet TAKE ONE TABLET BY MOUTH ONCE DAILY 30 tablet 0  . metoprolol tartrate (LOPRESSOR) 25 MG tablet TAKE ONE-HALF TABLET BY MOUTH TWICE DAILY 30 tablet 0  . metroNIDAZOLE (METROCREAM) 0.75 % cream Apply 1 application topically 2 (two) times daily as needed.    . Misc Natural Products (FOCUSED MIND PO) Take 1 tablet by mouth daily.    . nitroGLYCERIN (NITROSTAT) 0.4 MG SL tablet Place 1 tablet (0.4 mg total) under the tongue every 5 (five) minutes x 3 doses as needed for chest pain. 25 tablet 2  . omeprazole (PRILOSEC) 20 MG capsule Take 20 mg by mouth daily.      No current facility-administered medications for this visit.     Past Surgical History  Procedure Laterality Date  . Breast surgery Right     lumpectomy  . Left heart catheterization with coronary angiogram N/A 03/02/2014    Procedure: LEFT HEART CATHETERIZATION WITH CORONARY ANGIOGRAM;  Surgeon: Jettie Booze, MD;  Location: [facility] CATH LAB;  Service: Cardiovascular;  Laterality: N/A;  . Percutaneous coronary stent intervention (pci-s)  03/02/2014    Procedure: PERCUTANEOUS CORONARY STENT INTERVENTION (PCI-S);  Surgeon:  Jettie Booze, MD;  Location: [facility] CATH LAB;  Service: Cardiovascular;;     Allergies  Allergen Reactions  . Erythromycin Nausea And Vomiting  . Nsaids Nausea Only  . Sulfa Antibiotics Other (See Comments)    Dry mouth   . Penicillins Itching and Rash      Family History  Problem Relation Age of Onset  . Coronary artery disease Sister 53    MI     Social History Ms. Channel reports that she quit smoking about 17 years ago. Her smoking use included Cigarettes. She started smoking about 62 years ago. She has a 90 pack-year smoking history. She has never used smokeless tobacco. Ms. Bulnes reports that she  drinks about 1.2 oz of alcohol per week.   Review of Systems CONSTITUTIONAL: No weight loss, fever, chills, weakness or fatigue.  HEENT: Eyes: No visual loss, blurred vision, double vision or yellow sclerae.No hearing loss, sneezing, congestion, runny nose or sore throat.  SKIN: No rash or itching.  CARDIOVASCULAR: per HPI RESPIRATORY: No shortness of breath, cough or sputum.  GASTROINTESTINAL: No anorexia, nausea, vomiting or diarrhea. No abdominal pain or blood.  GENITOURINARY: No burning on urination, no polyuria NEUROLOGICAL: No headache, dizziness, syncope, paralysis, ataxia, numbness or tingling in the extremities. No change in bowel or bladder control.  MUSCULOSKELETAL: No muscle, back pain, joint pain or stiffness.  LYMPHATICS: No enlarged nodes. No history of splenectomy.  PSYCHIATRIC: No history of depression or anxiety.  ENDOCRINOLOGIC: No reports of sweating, cold or heat intolerance. No polyuria or polydipsia.  Marland Kitchen   Physical Examination Filed Vitals:   04/28/16 0855  BP: 133/79  Pulse: 56   Filed Vitals:   04/28/16 0855  Height: 5\' 2"  (1.575 m)  Weight: 143 lb (64.864 kg)    Gen: resting comfortably, no acute distress HEENT: no scleral icterus, pupils equal round and reactive, no palptable cervical adenopathy,  CV: RRR, no m/r/g, no jvd Resp: Clear to auscultation bilaterally GI: abdomen is soft, non-tender, non-distended, normal bowel sounds, no hepatosplenomegaly MSK: extremities are warm, no edema.  Skin: warm, no rash Neuro:  no focal deficits Psych: appropriate affect   Diagnostic Studies 02/2014 Cath  HEMODYNAMICS: Aortic pressure was 125/61; LV pressure was 127/4; LVEDP 12. There was no gradient between the left ventricle and aorta.  ANGIOGRAPHIC DATA: The left main coronary artery is widely patent.  The left anterior descending artery is a large vessel which reaches the apex. There is a moderate proximal to mid stenosis which is diffuse. The most  severe portion appears to be 40-50%. The first diagonal is small and has a mild ostial stenosis. The second diagonal is large and widely patent.  The left circumflex artery is a large vessel proximally. There are 2 small OM vessels. The third OM is the largest and there is a mild stenosis at the bifurcation. There is a large ramus vessel.  The right coronary artery is a large dominant vessel. There is a moderate area of disease in the mid portion followed by a 99% thrombotic stenosis. The posterolateral artery and PDA are large and patent. The ostial PDA has a mild stenosis.  LEFT VENTRICULOGRAM: Left ventricular angiogram was not done in the 30 RAO projection. LVEDP was 12 mmHg.  PCI NARRATIVE: A JR 4 Guide catheter was used. IV heparin and tirofiban were used. ACT was used to check that the anticoagulation was therapeutic. A prowater wire was placed across the lesion. A 2.5 x 15 balloon was used to  predilate. There was visible thrombus in the mid RCA post balloon. An aspiration catheter was successfully used to remove the thrombus. A 3.0 x 23 Xience DES was deployed and postdilated with a 3.5 x 15 [place] balloon. There was an excellent angiographic result with no residual stenosis. IC NTG was used.  IMPRESSIONS:   Normal left main coronary artery.  Moderate proximal to mid disease in the left anterior descending artery.  Mild disease in the left circumflex artery and its branches.  99% thrombotic lesion in the mid right coronary artery, successfully treated with a 3.0 x 23 Xience Drug eluting stent, postdilated to 3.5 mm in diameter.  Left ventricular systolic function not assessed. LVEDP 12 mmHg. RECOMMENDATION: Continue dual antiplatelet therapy for at least a year. She will need aggressive secondary prevention. She will need cardiology f/u given her moderate LAD disease as well.   03/01/14 Echo  Study Conclusions  - Left ventricle: The cavity size was normal. Wall thickness was  increased in a pattern of mild LVH. Systolic function was normal. The estimated ejection fraction was in the range of 50% to 55%. Wall motion was normal; there were no regional wall motion abnormalities. Doppler parameters are consistent with abnormal left ventricular relaxation (grade 1 diastolic dysfunction). - Right atrium: The atrium was mildly dilated.   12/2010 PFTs Moderate COPD   10/2015 Nuclear stress test  The study is normal.  This is a low risk study.  Nuclear stress EF: 66%.   Assessment and Plan  1. CAD  - recent atypical chest pain symptoms. Stress test 10/2015 without ischemia - EKG in clinic shows SR, PACs, and no ischemic changes - she will restart her lopressor and follow symptoms. We discussed possible cath however she is not in favor at this time. We will continue to follow symptoms and readdress in 6-8 weeks.    2. HTN  - restart her lopressor and hyzaar which she ran out a few month ago  3. Hyperlipidemia  - intolerant to several statins as well as zetia - continue diet control. She is fairly resistant to taking medications and historically has been somewhat indepenent on what she decides to take, I am not sure she would be willing to try a pcsk-9 inhibitor but will discuss at next visit.    F/u 6-8 weeks.   Arnoldo Lenis, M.D.

## 2016-05-07 DIAGNOSIS — H353211 Exudative age-related macular degeneration, right eye, with active choroidal neovascularization: Secondary | ICD-10-CM | POA: Diagnosis not present

## 2016-06-09 ENCOUNTER — Ambulatory Visit: Payer: Medicare Other | Admitting: Cardiology

## 2016-07-21 ENCOUNTER — Encounter: Payer: Self-pay | Admitting: *Deleted

## 2016-07-22 ENCOUNTER — Ambulatory Visit (INDEPENDENT_AMBULATORY_CARE_PROVIDER_SITE_OTHER): Payer: Medicare Other | Admitting: Cardiology

## 2016-07-22 ENCOUNTER — Encounter: Payer: Self-pay | Admitting: Cardiology

## 2016-07-22 VITALS — BP 129/72 | HR 47 | Ht 62.0 in | Wt 146.0 lb

## 2016-07-22 DIAGNOSIS — E785 Hyperlipidemia, unspecified: Secondary | ICD-10-CM

## 2016-07-22 DIAGNOSIS — I2510 Atherosclerotic heart disease of native coronary artery without angina pectoris: Secondary | ICD-10-CM

## 2016-07-22 NOTE — Progress Notes (Signed)
Clinical Summary Jodi Spencer is a 75 y.o.female  seen today for follow up of the following medical problems.   1. CAD  - NSTEMI 02/28/14, troponin peaked at 1.57  - cath showed LM patent, LAD moderate prox to mid disease, mild LCX disease, 99% RCA lesion that received a DES.  - echo LVEF 50-55%  - 10/2015 nuclear stress test without evidence for ischemia    - denies any recurrent chest pain. Can have some SOB. DOE is variable. +chronic cough. Breathing worst with weather, allergies. Can have some LE edema at times.    2. Hyperlipidemia  - she has been intolerant to multiple statins, most recently pravastatin. - last visit we tried starting zetia. Zetia caused muscle aches and she stopped taking.    4. ? Aneurysm - possible aneurysm noted on CT A/P at [facility] 02/2015. Report indicates questionable venous varix vs 85mm thrombosed saccular anerusym. Patient has not wanted repeat imaging.  Past Medical History:  Diagnosis Date  . Fibromyalgia   . GERD (gastroesophageal reflux disease)      Allergies  Allergen Reactions  . Erythromycin Nausea And Vomiting  . Nsaids Nausea Only  . Sulfa Antibiotics Other (See Comments)    Dry mouth   . Penicillins Itching and Rash     Current Outpatient Prescriptions  Medication Sig Dispense Refill  . Acetaminophen (TYLENOL ARTHRITIS PAIN PO) Take 2 tablets by mouth at bedtime.     . ALPRAZolam (XANAX) 0.5 MG tablet Take 0.5 mg by mouth 2 (two) times daily as needed (takes one at bedtime every night).     Marland Kitchen amLODipine (NORVASC) 5 MG tablet Take 1 tablet (5 mg total) by mouth daily. 90 tablet 3  . aspirin 81 MG tablet Take 81 mg by mouth daily.    . Biotin 1000 MCG tablet Take 1,000 mcg by mouth as directed.    . gabapentin (NEURONTIN) 100 MG capsule Take 1 capsule by mouth 2 (two) times daily.    Marland Kitchen HYDROcodone-acetaminophen (NORCO/VICODIN) 5-325 MG tablet Take 1 tablet by mouth as needed for moderate pain.    Marland Kitchen  losartan-hydrochlorothiazide (HYZAAR) 100-12.5 MG tablet Take 1 tablet by mouth daily. 90 tablet 3  . metoprolol tartrate (LOPRESSOR) 25 MG tablet Take 0.5 tablets (12.5 mg total) by mouth 2 (two) times daily. 45 tablet 3  . metroNIDAZOLE (METROCREAM) 0.75 % cream Apply 1 application topically 2 (two) times daily as needed.    . Misc Natural Products (FOCUSED MIND PO) Take 1 tablet by mouth daily.    . nitroGLYCERIN (NITROSTAT) 0.4 MG SL tablet Place 1 tablet (0.4 mg total) under the tongue every 5 (five) minutes x 3 doses as needed for chest pain. 25 tablet 2  . omeprazole (PRILOSEC) 20 MG capsule Take 20 mg by mouth daily.     . Red Yeast Rice Extract (RED YEAST RICE PO) Take 1 capsule by mouth 3 (three) times a week.     No current facility-administered medications for this visit.      Past Surgical History:  Procedure Laterality Date  . BREAST SURGERY Right    lumpectomy  . LEFT HEART CATHETERIZATION WITH CORONARY ANGIOGRAM N/A 03/02/2014   Procedure: LEFT HEART CATHETERIZATION WITH CORONARY ANGIOGRAM;  Surgeon: Jettie Booze, MD;  Location: [facility] CATH LAB;  Service: Cardiovascular;  Laterality: N/A;  . PERCUTANEOUS CORONARY STENT INTERVENTION (PCI-S)  03/02/2014   Procedure: PERCUTANEOUS CORONARY STENT INTERVENTION (PCI-S);  Surgeon: Jettie Booze, MD;  Location: [facility] CATH  LAB;  Service: Cardiovascular;;     Allergies  Allergen Reactions  . Erythromycin Nausea And Vomiting  . Nsaids Nausea Only  . Sulfa Antibiotics Other (See Comments)    Dry mouth   . Penicillins Itching and Rash      Family History  Problem Relation Age of Onset  . Coronary artery disease Sister 54    MI     Social History Ms. Muhs reports that she quit smoking about 17 years ago. Her smoking use included Cigarettes. She started smoking about 62 years ago. She has a 90.00 pack-year smoking history. She has never used smokeless tobacco. Ms. Surratt reports that she drinks about 1.2 oz of  alcohol per week .   Review of Systems CONSTITUTIONAL: No weight loss, fever, chills, weakness or fatigue.  HEENT: Eyes: No visual loss, blurred vision, double vision or yellow sclerae.No hearing loss, sneezing, congestion, runny nose or sore throat.  SKIN: No rash or itching.  CARDIOVASCULAR: per HPI RESPIRATORY: No shortness of breath, cough or sputum.  GASTROINTESTINAL: No anorexia, nausea, vomiting or diarrhea. No abdominal pain or blood.  GENITOURINARY: No burning on urination, no polyuria NEUROLOGICAL: No headache, dizziness, syncope, paralysis, ataxia, numbness or tingling in the extremities. No change in bowel or bladder control.  MUSCULOSKELETAL: No muscle, back pain, joint pain or stiffness.  LYMPHATICS: No enlarged nodes. No history of splenectomy.  PSYCHIATRIC: No history of depression or anxiety.  ENDOCRINOLOGIC: No reports of sweating, cold or heat intolerance. No polyuria or polydipsia.  Marland Kitchen   Physical Examination Vitals:   07/22/16 0856  BP: 129/72  Pulse: (!) 47   Vitals:   07/22/16 0856  Weight: 146 lb (66.2 kg)  Height: 5\' 2"  (1.575 m)    Gen: resting comfortably, no acute distress HEENT: no scleral icterus, pupils equal round and reactive, no palptable cervical adenopathy,  CV: RRR, no m/r/g, no jvd Resp: Clear to auscultation bilaterally GI: abdomen is soft, non-tender, non-distended, normal bowel sounds, no hepatosplenomegaly MSK: extremities are warm, no edema.  Skin: warm, no rash Neuro:  no focal deficits Psych: appropriate affect   Diagnostic Studies 02/2014 Cath  HEMODYNAMICS: Aortic pressure was 125/61; LV pressure was 127/4; LVEDP 12. There was no gradient between the left ventricle and aorta.  ANGIOGRAPHIC DATA: The left main coronary artery is widely patent.  The left anterior descending artery is a large vessel which reaches the apex. There is a moderate proximal to mid stenosis which is diffuse. The most severe portion appears to be  40-50%. The first diagonal is small and has a mild ostial stenosis. The second diagonal is large and widely patent.  The left circumflex artery is a large vessel proximally. There are 2 small OM vessels. The third OM is the largest and there is a mild stenosis at the bifurcation. There is a large ramus vessel.  The right coronary artery is a large dominant vessel. There is a moderate area of disease in the mid portion followed by a 99% thrombotic stenosis. The posterolateral artery and PDA are large and patent. The ostial PDA has a mild stenosis.  LEFT VENTRICULOGRAM: Left ventricular angiogram was not done in the 30 RAO projection. LVEDP was 12 mmHg.  PCI NARRATIVE: A JR 4 Guide catheter was used. IV heparin and tirofiban were used. ACT was used to check that the anticoagulation was therapeutic. A prowater wire was placed across the lesion. A 2.5 x 15 balloon was used to predilate. There was visible thrombus in the mid  RCA post balloon. An aspiration catheter was successfully used to remove the thrombus. A 3.0 x 23 Xience DES was deployed and postdilated with a 3.5 x 15 [place] balloon. There was an excellent angiographic result with no residual stenosis. IC NTG was used.  IMPRESSIONS:   Normal left main coronary artery.  Moderate proximal to mid disease in the left anterior descending artery.  Mild disease in the left circumflex artery and its branches.  99% thrombotic lesion in the mid right coronary artery, successfully treated with a 3.0 x 23 Xience Drug eluting stent, postdilated to 3.5 mm in diameter.  Left ventricular systolic function not assessed. LVEDP 12 mmHg. RECOMMENDATION: Continue dual antiplatelet therapy for at least a year. She will need aggressive secondary prevention. She will need cardiology f/u given her moderate LAD disease as well.   03/01/14 Echo  Study Conclusions  - Left ventricle: The cavity size was normal. Wall thickness was increased in a pattern of mild LVH.  Systolic function was normal. The estimated ejection fraction was in the range of 50% to 55%. Wall motion was normal; there were no regional wall motion abnormalities. Doppler parameters are consistent with abnormal left ventricular relaxation (grade 1 diastolic dysfunction). - Right atrium: The atrium was mildly dilated.   12/2010 PFTs Moderate COPD   10/2015 Nuclear stress test  The study is normal.  This is a low risk study.  Nuclear stress EF: 66%.    Assessment and Plan  1. CAD  Stress test 10/2015 without ischemia - previous chest pain symptoms have resolved, no further testing at this time, continue current meds   2. Hyperlipidemia  - intolerant to several statins as well as zetia - continue diet control. She is fairly resistant to taking medications and is not interested in any alternatives     F/u 6 months      Arnoldo Lenis, M.D.

## 2016-07-22 NOTE — Patient Instructions (Signed)

## 2016-08-03 DIAGNOSIS — K219 Gastro-esophageal reflux disease without esophagitis: Secondary | ICD-10-CM | POA: Diagnosis not present

## 2016-08-03 DIAGNOSIS — R5383 Other fatigue: Secondary | ICD-10-CM | POA: Diagnosis not present

## 2016-08-03 DIAGNOSIS — I10 Essential (primary) hypertension: Secondary | ICD-10-CM | POA: Diagnosis not present

## 2016-08-03 DIAGNOSIS — J449 Chronic obstructive pulmonary disease, unspecified: Secondary | ICD-10-CM | POA: Diagnosis not present

## 2016-08-03 DIAGNOSIS — E782 Mixed hyperlipidemia: Secondary | ICD-10-CM | POA: Diagnosis not present

## 2016-08-06 DIAGNOSIS — E782 Mixed hyperlipidemia: Secondary | ICD-10-CM | POA: Diagnosis not present

## 2016-08-06 DIAGNOSIS — M542 Cervicalgia: Secondary | ICD-10-CM | POA: Diagnosis not present

## 2016-08-06 DIAGNOSIS — I10 Essential (primary) hypertension: Secondary | ICD-10-CM | POA: Diagnosis not present

## 2016-08-06 DIAGNOSIS — M545 Low back pain: Secondary | ICD-10-CM | POA: Diagnosis not present

## 2016-08-06 DIAGNOSIS — Z1389 Encounter for screening for other disorder: Secondary | ICD-10-CM | POA: Diagnosis not present

## 2016-08-07 DIAGNOSIS — H353122 Nonexudative age-related macular degeneration, left eye, intermediate dry stage: Secondary | ICD-10-CM | POA: Diagnosis not present

## 2016-08-07 DIAGNOSIS — H353211 Exudative age-related macular degeneration, right eye, with active choroidal neovascularization: Secondary | ICD-10-CM | POA: Diagnosis not present

## 2016-08-10 DIAGNOSIS — L570 Actinic keratosis: Secondary | ICD-10-CM | POA: Diagnosis not present

## 2016-08-10 DIAGNOSIS — L259 Unspecified contact dermatitis, unspecified cause: Secondary | ICD-10-CM | POA: Diagnosis not present

## 2016-08-10 DIAGNOSIS — L821 Other seborrheic keratosis: Secondary | ICD-10-CM | POA: Diagnosis not present

## 2016-08-18 DIAGNOSIS — Z23 Encounter for immunization: Secondary | ICD-10-CM | POA: Diagnosis not present

## 2016-09-11 DIAGNOSIS — Z1231 Encounter for screening mammogram for malignant neoplasm of breast: Secondary | ICD-10-CM | POA: Diagnosis not present

## 2016-09-15 ENCOUNTER — Other Ambulatory Visit: Payer: Self-pay | Admitting: *Deleted

## 2016-09-15 MED ORDER — LOSARTAN POTASSIUM-HCTZ 100-12.5 MG PO TABS: 1.0000 | ORAL_TABLET | Freq: Every day | ORAL | 3 refills | Status: DC

## 2016-11-02 ENCOUNTER — Other Ambulatory Visit: Payer: Self-pay | Admitting: Cardiology

## 2017-01-01 DIAGNOSIS — H353211 Exudative age-related macular degeneration, right eye, with active choroidal neovascularization: Secondary | ICD-10-CM | POA: Diagnosis not present

## 2017-02-04 DIAGNOSIS — I10 Essential (primary) hypertension: Secondary | ICD-10-CM | POA: Diagnosis not present

## 2017-02-04 DIAGNOSIS — K219 Gastro-esophageal reflux disease without esophagitis: Secondary | ICD-10-CM | POA: Diagnosis not present

## 2017-02-04 DIAGNOSIS — R5383 Other fatigue: Secondary | ICD-10-CM | POA: Diagnosis not present

## 2017-02-04 DIAGNOSIS — E782 Mixed hyperlipidemia: Secondary | ICD-10-CM | POA: Diagnosis not present

## 2017-02-07 DIAGNOSIS — Z87891 Personal history of nicotine dependence: Secondary | ICD-10-CM | POA: Diagnosis not present

## 2017-02-07 DIAGNOSIS — Z951 Presence of aortocoronary bypass graft: Secondary | ICD-10-CM | POA: Diagnosis not present

## 2017-02-07 DIAGNOSIS — R251 Tremor, unspecified: Secondary | ICD-10-CM | POA: Diagnosis not present

## 2017-02-07 DIAGNOSIS — K5900 Constipation, unspecified: Secondary | ICD-10-CM | POA: Diagnosis not present

## 2017-02-07 DIAGNOSIS — M797 Fibromyalgia: Secondary | ICD-10-CM | POA: Diagnosis not present

## 2017-02-07 DIAGNOSIS — E7800 Pure hypercholesterolemia, unspecified: Secondary | ICD-10-CM | POA: Diagnosis not present

## 2017-02-07 DIAGNOSIS — Z955 Presence of coronary angioplasty implant and graft: Secondary | ICD-10-CM | POA: Diagnosis not present

## 2017-02-07 DIAGNOSIS — R112 Nausea with vomiting, unspecified: Secondary | ICD-10-CM | POA: Diagnosis not present

## 2017-02-07 DIAGNOSIS — I252 Old myocardial infarction: Secondary | ICD-10-CM | POA: Diagnosis not present

## 2017-02-07 DIAGNOSIS — Z79899 Other long term (current) drug therapy: Secondary | ICD-10-CM | POA: Diagnosis not present

## 2017-02-07 DIAGNOSIS — R1084 Generalized abdominal pain: Secondary | ICD-10-CM | POA: Diagnosis not present

## 2017-02-07 DIAGNOSIS — Z7982 Long term (current) use of aspirin: Secondary | ICD-10-CM | POA: Diagnosis not present

## 2017-02-07 DIAGNOSIS — K8021 Calculus of gallbladder without cholecystitis with obstruction: Secondary | ICD-10-CM | POA: Diagnosis not present

## 2017-02-08 DIAGNOSIS — K8020 Calculus of gallbladder without cholecystitis without obstruction: Secondary | ICD-10-CM | POA: Diagnosis not present

## 2017-02-08 DIAGNOSIS — R1011 Right upper quadrant pain: Secondary | ICD-10-CM | POA: Diagnosis not present

## 2017-02-11 DIAGNOSIS — K219 Gastro-esophageal reflux disease without esophagitis: Secondary | ICD-10-CM | POA: Diagnosis not present

## 2017-02-11 DIAGNOSIS — E782 Mixed hyperlipidemia: Secondary | ICD-10-CM | POA: Diagnosis not present

## 2017-02-11 DIAGNOSIS — I10 Essential (primary) hypertension: Secondary | ICD-10-CM | POA: Diagnosis not present

## 2017-02-11 DIAGNOSIS — M25562 Pain in left knee: Secondary | ICD-10-CM | POA: Diagnosis not present

## 2017-02-15 IMAGING — NM NM MYOCAR MULTI W/SPECT W/WALL MOTION & EF
2 series · 12 of 12 positions shown · non-contrast
Comparison: none

[Series 1: rest · 8.28mm/px · 6 of 64 frames shown]
[frame 6/64]
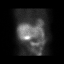
[frame 16/64]
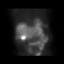
[frame 27/64]
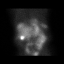
[frame 38/64]
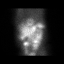
[frame 48/64]
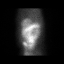
[frame 59/64]
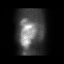

[Series 2: stress gated · 8.28mm/px · 6 of 64 frames shown]
[frame 6/64]
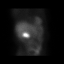
[frame 16/64]
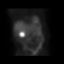
[frame 27/64]
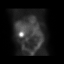
[frame 38/64]
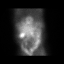
[frame 48/64]
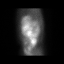
[frame 59/64]
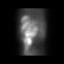

[12 of 12 positions shown; findings below may reference images not displayed]

Canned report from images found in remote index.

Refer to host system for actual result text.

## 2017-02-17 DIAGNOSIS — K8012 Calculus of gallbladder with acute and chronic cholecystitis without obstruction: Secondary | ICD-10-CM | POA: Diagnosis not present

## 2017-02-19 DIAGNOSIS — Z87891 Personal history of nicotine dependence: Secondary | ICD-10-CM | POA: Diagnosis not present

## 2017-02-19 DIAGNOSIS — I252 Old myocardial infarction: Secondary | ICD-10-CM | POA: Diagnosis not present

## 2017-02-19 DIAGNOSIS — M797 Fibromyalgia: Secondary | ICD-10-CM | POA: Diagnosis not present

## 2017-02-19 DIAGNOSIS — Z79899 Other long term (current) drug therapy: Secondary | ICD-10-CM | POA: Diagnosis not present

## 2017-02-19 DIAGNOSIS — R112 Nausea with vomiting, unspecified: Secondary | ICD-10-CM | POA: Diagnosis not present

## 2017-02-19 DIAGNOSIS — Z880 Allergy status to penicillin: Secondary | ICD-10-CM | POA: Diagnosis not present

## 2017-02-19 DIAGNOSIS — K8020 Calculus of gallbladder without cholecystitis without obstruction: Secondary | ICD-10-CM | POA: Diagnosis not present

## 2017-02-19 DIAGNOSIS — K219 Gastro-esophageal reflux disease without esophagitis: Secondary | ICD-10-CM | POA: Diagnosis not present

## 2017-02-19 DIAGNOSIS — Z951 Presence of aortocoronary bypass graft: Secondary | ICD-10-CM | POA: Diagnosis not present

## 2017-02-19 DIAGNOSIS — Z886 Allergy status to analgesic agent status: Secondary | ICD-10-CM | POA: Diagnosis not present

## 2017-02-19 DIAGNOSIS — Z955 Presence of coronary angioplasty implant and graft: Secondary | ICD-10-CM | POA: Diagnosis not present

## 2017-02-19 DIAGNOSIS — Z7982 Long term (current) use of aspirin: Secondary | ICD-10-CM | POA: Diagnosis not present

## 2017-02-19 DIAGNOSIS — Z882 Allergy status to sulfonamides status: Secondary | ICD-10-CM | POA: Diagnosis not present

## 2017-02-19 DIAGNOSIS — I10 Essential (primary) hypertension: Secondary | ICD-10-CM | POA: Diagnosis not present

## 2017-02-19 DIAGNOSIS — Z881 Allergy status to other antibiotic agents status: Secondary | ICD-10-CM | POA: Diagnosis not present

## 2017-02-19 DIAGNOSIS — K8010 Calculus of gallbladder with chronic cholecystitis without obstruction: Secondary | ICD-10-CM | POA: Diagnosis not present

## 2017-02-22 DIAGNOSIS — K8010 Calculus of gallbladder with chronic cholecystitis without obstruction: Secondary | ICD-10-CM | POA: Diagnosis not present

## 2017-03-01 DIAGNOSIS — H353211 Exudative age-related macular degeneration, right eye, with active choroidal neovascularization: Secondary | ICD-10-CM | POA: Diagnosis not present

## 2017-03-01 DIAGNOSIS — H353122 Nonexudative age-related macular degeneration, left eye, intermediate dry stage: Secondary | ICD-10-CM | POA: Diagnosis not present

## 2017-04-23 ENCOUNTER — Encounter: Payer: Self-pay | Admitting: *Deleted

## 2017-04-23 ENCOUNTER — Encounter: Payer: Self-pay | Admitting: Cardiology

## 2017-04-23 ENCOUNTER — Ambulatory Visit (INDEPENDENT_AMBULATORY_CARE_PROVIDER_SITE_OTHER): Payer: Medicare Other | Admitting: Cardiology

## 2017-04-23 VITALS — BP 120/69 | HR 56 | Ht 62.0 in | Wt 138.0 lb

## 2017-04-23 DIAGNOSIS — E782 Mixed hyperlipidemia: Secondary | ICD-10-CM | POA: Diagnosis not present

## 2017-04-23 DIAGNOSIS — I2510 Atherosclerotic heart disease of native coronary artery without angina pectoris: Secondary | ICD-10-CM

## 2017-04-23 NOTE — Progress Notes (Signed)
Clinical Summary Jodi Spencer is a 76 y.o.female seen today for follow up of the following medical problems.   1. CAD  - NSTEMI 02/28/14, troponin peaked at 1.57  - cath showed LM patent, LAD moderate prox to mid disease, mild LCX disease, 99% RCA lesion that received a DES.  - echo LVEF 50-55%  - 10/2015 nuclear stress test without evidence for ischemia    - no recent chest pain. No SOB or DOE - ran out of lopressor recently    2. Hyperlipidemia  - she has been intolerant to multiple statins, most recently pravastatin. - last visit we tried starting zetia. Zetia caused muscle aches and she stopped taking.  - she has not been interested in repatha   3. ? Aneurysm - possible aneurysm noted on CT A/P at [facility] 02/2015. Report indicates questionable venous varix vs 84mm thrombosed saccular anerusym. Patient has not wanted repeat imaging.   5. Gallbladder surgery - surgery in 02/2017, has recovered well  SH: Daughter Jodi Spencer is also a patient of mine  Past Medical History:  Diagnosis Date  . Fibromyalgia   . GERD (gastroesophageal reflux disease)      Allergies  Allergen Reactions  . Erythromycin Nausea And Vomiting  . Nsaids Nausea Only  . Sulfa Antibiotics Other (See Comments)    Dry mouth   . Penicillins Itching and Rash     Current Outpatient Prescriptions  Medication Sig Dispense Refill  . Acetaminophen (TYLENOL ARTHRITIS PAIN PO) Take 2 tablets by mouth at bedtime.     . ALPRAZolam (XANAX) 0.5 MG tablet Take 0.5 mg by mouth 2 (two) times daily as needed (takes one at bedtime every night).     Marland Kitchen amLODipine (NORVASC) 5 MG tablet Take 1 tablet (5 mg total) by mouth daily. 90 tablet 3  . amLODipine (NORVASC) 5 MG tablet Take 1 tablet (5 mg total) by mouth daily. 90 tablet 3  . aspirin 81 MG tablet Take 81 mg by mouth daily.    . Biotin 1000 MCG tablet Take 1,000 mcg by mouth as directed.    . gabapentin (NEURONTIN) 100 MG capsule Take 1 capsule  by mouth daily.     Marland Kitchen HYDROcodone-acetaminophen (NORCO/VICODIN) 5-325 MG tablet Take 1 tablet by mouth as needed for moderate pain.    Marland Kitchen losartan-hydrochlorothiazide (HYZAAR) 100-12.5 MG tablet Take 1 tablet by mouth daily. 90 tablet 3  . metoprolol tartrate (LOPRESSOR) 25 MG tablet Take 0.5 tablets (12.5 mg total) by mouth 2 (two) times daily. 45 tablet 3  . nitroGLYCERIN (NITROSTAT) 0.4 MG SL tablet Place 1 tablet (0.4 mg total) under the tongue every 5 (five) minutes x 3 doses as needed for chest pain. 25 tablet 2  . omeprazole (PRILOSEC) 20 MG capsule Take 20 mg by mouth daily.     . Potassium 99 MG TABS Take 1 tablet by mouth daily.     No current facility-administered medications for this visit.      Past Surgical History:  Procedure Laterality Date  . BREAST SURGERY Right    lumpectomy  . LEFT HEART CATHETERIZATION WITH CORONARY ANGIOGRAM N/A 03/02/2014   Procedure: LEFT HEART CATHETERIZATION WITH CORONARY ANGIOGRAM;  Surgeon: Jettie Booze, MD;  Location: [facility] CATH LAB;  Service: Cardiovascular;  Laterality: N/A;  . PERCUTANEOUS CORONARY STENT INTERVENTION (PCI-S)  03/02/2014   Procedure: PERCUTANEOUS CORONARY STENT INTERVENTION (PCI-S);  Surgeon: Jettie Booze, MD;  Location: [facility] CATH LAB;  Service: Cardiovascular;;  Allergies  Allergen Reactions  . Erythromycin Nausea And Vomiting  . Nsaids Nausea Only  . Sulfa Antibiotics Other (See Comments)    Dry mouth   . Penicillins Itching and Rash      Family History  Problem Relation Age of Onset  . Coronary artery disease Sister 38       MI     Social History [name] reports that she quit smoking about 18 years ago. Her smoking use included Cigarettes. She started smoking about 63 years ago. She has a 90.00 pack-year smoking history. She has never used smokeless tobacco. [name] reports that she drinks about 1.2 oz of alcohol per week .   Review of Systems CONSTITUTIONAL: No weight loss, fever,  chills, weakness or fatigue.  HEENT: Eyes: No visual loss, blurred vision, double vision or yellow sclerae.No hearing loss, sneezing, congestion, runny nose or sore throat.  SKIN: No rash or itching.  CARDIOVASCULAR: per hpi RESPIRATORY: No shortness of breath, cough or sputum.  GASTROINTESTINAL: No anorexia, nausea, vomiting or diarrhea. No abdominal pain or blood.  GENITOURINARY: No burning on urination, no polyuria NEUROLOGICAL: No headache, dizziness, syncope, paralysis, ataxia, numbness or tingling in the extremities. No change in bowel or bladder control.  MUSCULOSKELETAL: No muscle, back pain, joint pain or stiffness.  LYMPHATICS: No enlarged nodes. No history of splenectomy.  PSYCHIATRIC: No history of depression or anxiety.  ENDOCRINOLOGIC: No reports of sweating, cold or heat intolerance. No polyuria or polydipsia.  Marland Kitchen   Physical Examination Vitals:   04/23/17 1023  BP: 120/69  Pulse: (!) 56   Vitals:   04/23/17 1023  Weight: 138 lb (62.6 kg)  Height: 5\' 2"  (1.575 m)    Gen: resting comfortably, no acute distress HEENT: no scleral icterus, pupils equal round and reactive, no palptable cervical adenopathy,  CV: RRR, no m/r,g no jvd Resp: Clear to auscultation bilaterally GI: abdomen is soft, non-tender, non-distended, normal bowel sounds, no hepatosplenomegaly MSK: extremities are warm, no edema.  Skin: warm, no rash Neuro:  no focal deficits Psych: appropriate affect   Diagnostic Studies 02/2014 Cath HEMODYNAMICS:Aortic pressure was 125/61; LV pressure was 127/4; LVEDP 12. There was no gradient between the left ventricle and aorta.  ANGIOGRAPHIC DATA:The left main coronary artery is widely patent.  The left anterior descending artery is a large vessel which reaches the apex. There is a moderate proximal to mid stenosis which is diffuse. The most severe portion appears to be 40-50%. The first diagonal is small and has a mild ostial stenosis. The second diagonal  is large and widely patent.  The left circumflex artery is a large vessel proximally. There are 2 small OM vessels. The third OM is the largest and there is a mild stenosis at the bifurcation. There is a large ramus vessel.  The right coronary artery is a large dominant vessel. There is a moderate area of disease in the mid portion followed by a 99% thrombotic stenosis. The posterolateral artery and PDA are large and patent. The ostial PDA has a mild stenosis.  LEFT VENTRICULOGRAM:Left ventricular angiogram was not done in the 30 RAO projection. LVEDP was 12 mmHg.  PCI NARRATIVE: A JR 4 Guide catheter was used. IV heparin and tirofiban were used. ACT was used to check that the anticoagulation was therapeutic. A prowater wire was placed across the lesion. A 2.5 x 15 balloon was used to predilate. There was visible thrombus in the mid RCA post balloon. An aspiration catheter was successfully used  to remove the thrombus. A 3.0 x 23 Xience DES was deployed and postdilated with a 3.5 x 15 [place] balloon. There was an excellent angiographic result with no residual stenosis. IC NTG was used.  IMPRESSIONS:  Normal left main coronary artery.  Moderate proximal to mid disease in the left anterior descending artery.  Mild disease in the left circumflex artery and its branches.  99% thrombotic lesion in the mid right coronary artery, successfully treated with a 3.0 x 23 Xience Drug eluting stent, postdilated to 3.5 mm in diameter.  Left ventricular systolic function not assessed. LVEDP 12 mmHg. RECOMMENDATION:Continue dual antiplatelet therapy for at least a year. She will need aggressive secondary prevention. She will need cardiology f/u given her moderate LAD disease as well.   03/01/14 Echo Study Conclusions  - Left ventricle: The cavity size was normal. Wall thickness was increased in a pattern of mild LVH. Systolic function was normal. The estimated ejection fraction was in the range of 50%  to 55%. Wall motion was normal; there were no regional wall motion abnormalities. Doppler parameters are consistent with abnormal left ventricular relaxation (grade 1 diastolic dysfunction). - Right atrium: The atrium was mildly dilated.  12/2010 PFTs Moderate COPD   10/2015 Nuclear stress test  The study is normal.  This is a low risk study.  Nuclear stress EF: 66%.      Assessment and Plan   1. CAD  Stress test 10/2015 without ischemia - no recent chest pain. EKG in clinic SR, no acute ischemic changes - continue current meds - ran out of lopressor. She has low heart rates at baseline, will not restart.    2. Hyperlipidemia  - intolerant to several statins as well as zetia - she is not interested in repatha - continue dietary contorl     F/u 6 months     Arnoldo Lenis, M.D.

## 2017-04-23 NOTE — Patient Instructions (Signed)
Your physician wants you to follow-up in: [place] will receive a reminder letter in the mail two months in advance. If you don't receive a letter, please call our office to schedule the follow-up appointment.  Your physician has recommended you make the following change in your medication:   STOP LOPRESSOR   Thank you for choosing [facility]!!

## 2017-05-18 DIAGNOSIS — H353211 Exudative age-related macular degeneration, right eye, with active choroidal neovascularization: Secondary | ICD-10-CM | POA: Diagnosis not present

## 2017-07-29 DIAGNOSIS — H353211 Exudative age-related macular degeneration, right eye, with active choroidal neovascularization: Secondary | ICD-10-CM | POA: Diagnosis not present

## 2017-08-02 DIAGNOSIS — H353211 Exudative age-related macular degeneration, right eye, with active choroidal neovascularization: Secondary | ICD-10-CM | POA: Diagnosis not present

## 2017-08-02 DIAGNOSIS — H353122 Nonexudative age-related macular degeneration, left eye, intermediate dry stage: Secondary | ICD-10-CM | POA: Diagnosis not present

## 2017-08-02 DIAGNOSIS — Z961 Presence of intraocular lens: Secondary | ICD-10-CM | POA: Diagnosis not present

## 2017-08-11 DIAGNOSIS — K5900 Constipation, unspecified: Secondary | ICD-10-CM | POA: Diagnosis not present

## 2017-08-11 DIAGNOSIS — J189 Pneumonia, unspecified organism: Secondary | ICD-10-CM | POA: Diagnosis not present

## 2017-08-17 ENCOUNTER — Encounter (INDEPENDENT_AMBULATORY_CARE_PROVIDER_SITE_OTHER): Payer: Self-pay | Admitting: *Deleted

## 2017-08-17 DIAGNOSIS — Z6824 Body mass index (BMI) 24.0-24.9, adult: Secondary | ICD-10-CM | POA: Diagnosis not present

## 2017-08-17 DIAGNOSIS — J209 Acute bronchitis, unspecified: Secondary | ICD-10-CM | POA: Diagnosis not present

## 2017-09-01 DIAGNOSIS — E782 Mixed hyperlipidemia: Secondary | ICD-10-CM | POA: Diagnosis not present

## 2017-09-01 DIAGNOSIS — K219 Gastro-esophageal reflux disease without esophagitis: Secondary | ICD-10-CM | POA: Diagnosis not present

## 2017-09-01 DIAGNOSIS — I10 Essential (primary) hypertension: Secondary | ICD-10-CM | POA: Diagnosis not present

## 2017-09-01 DIAGNOSIS — J449 Chronic obstructive pulmonary disease, unspecified: Secondary | ICD-10-CM | POA: Diagnosis not present

## 2017-09-06 DIAGNOSIS — I209 Angina pectoris, unspecified: Secondary | ICD-10-CM | POA: Diagnosis not present

## 2017-09-06 DIAGNOSIS — E782 Mixed hyperlipidemia: Secondary | ICD-10-CM | POA: Diagnosis not present

## 2017-09-06 DIAGNOSIS — I10 Essential (primary) hypertension: Secondary | ICD-10-CM | POA: Diagnosis not present

## 2017-09-06 DIAGNOSIS — Z0000 Encounter for general adult medical examination without abnormal findings: Secondary | ICD-10-CM | POA: Diagnosis not present

## 2017-09-13 DIAGNOSIS — H353211 Exudative age-related macular degeneration, right eye, with active choroidal neovascularization: Secondary | ICD-10-CM | POA: Diagnosis not present

## 2017-10-08 DIAGNOSIS — Z23 Encounter for immunization: Secondary | ICD-10-CM | POA: Diagnosis not present

## 2017-10-28 ENCOUNTER — Other Ambulatory Visit: Payer: Self-pay | Admitting: Cardiology

## 2017-11-26 DIAGNOSIS — H353211 Exudative age-related macular degeneration, right eye, with active choroidal neovascularization: Secondary | ICD-10-CM | POA: Diagnosis not present

## 2017-12-20 ENCOUNTER — Ambulatory Visit: Payer: Medicare Other | Admitting: Cardiology

## 2017-12-20 ENCOUNTER — Encounter: Payer: Self-pay | Admitting: Cardiology

## 2017-12-20 ENCOUNTER — Telehealth: Payer: Self-pay | Admitting: Cardiology

## 2017-12-20 VITALS — BP 138/80 | HR 57 | Ht 62.0 in | Wt 141.6 lb

## 2017-12-20 DIAGNOSIS — I2510 Atherosclerotic heart disease of native coronary artery without angina pectoris: Secondary | ICD-10-CM | POA: Diagnosis not present

## 2017-12-20 DIAGNOSIS — R079 Chest pain, unspecified: Secondary | ICD-10-CM | POA: Diagnosis not present

## 2017-12-20 DIAGNOSIS — E782 Mixed hyperlipidemia: Secondary | ICD-10-CM

## 2017-12-20 NOTE — Patient Instructions (Signed)
Your physician recommends that you schedule a follow-up appointment in: [place] recommends that you continue on your current medications as directed. Please refer to the Current Medication list given to you today.  CORONARY CTA - THE [facility] OFFICE WILL CONTACT YOU TO SCHEDULE THIS   Thank you for choosing [facility]!!

## 2017-12-20 NOTE — Telephone Encounter (Signed)
CORONARY CTA  Has not been scheduled   Has been sent to scheduling [facility]

## 2017-12-20 NOTE — Progress Notes (Signed)
Clinical Summary Jodi Spencer is a 77 y.o.female seen today for follow up of the following medical problems.   1. CAD  - NSTEMI 02/28/14, troponin peaked at 1.57  - cath showed LM patent, LAD moderate prox to mid disease, mild LCX disease, 99% RCA lesion that received a DES.  - echo LVEF 50-55%  - 10/2015 nuclear stress test without evidence for ischemia    - reports recent squeezing like feeling entire chest, can occur at rest. Can get shaky. Lasts just a few seconds.  - difficultly moving arms - similar to prior MI - increasing in frequency, now occurring weekly - sedentary lifestyle. Some DOE with her daily activities - compliant with meds  2. Hyperlipidemia  - she has been intolerant to multiple statins, most recently pravastatin. - last visit we tried starting zetia. Zetia caused muscle aches and she stopped taking.  - she has not been interested in repatha   3. ? Aneurysm - possible aneurysm noted on CT A/P at [facility] 02/2015. Report indicates questionable venous varix vs 57mm thrombosed saccular anerusym. Patient has not wanted repeat imaging.    4. COPD - recent coughing and wheezing. Followed by pcp    SH: Daughter Jodi Spencer is also a patient of mine   Past Medical History:  Diagnosis Date  . Fibromyalgia   . GERD (gastroesophageal reflux disease)      Allergies  Allergen Reactions  . Codeine Nausea Only  . Erythromycin Nausea And Vomiting  . Nsaids Nausea Only  . Sulfa Antibiotics Other (See Comments)    Dry mouth   . Penicillins Itching and Rash     Current Outpatient Medications  Medication Sig Dispense Refill  . Acetaminophen (TYLENOL ARTHRITIS PAIN PO) Take 2 tablets by mouth at bedtime.     . ALPRAZolam (XANAX) 1 MG tablet Take 0.5 tablets by mouth at bedtime as needed.    Marland Kitchen amLODipine (NORVASC) 5 MG tablet Take 1 tablet (5 mg total) by mouth daily. 90 tablet 3  . aspirin 81 MG tablet Take 81 mg by mouth daily.    Marland Kitchen  gabapentin (NEURONTIN) 100 MG capsule Take 1 capsule by mouth daily.     Marland Kitchen HYDROcodone-acetaminophen (NORCO/VICODIN) 5-325 MG tablet Take 1 tablet by mouth as needed for moderate pain.    Marland Kitchen losartan-hydrochlorothiazide (HYZAAR) 100-12.5 MG tablet TAKE ONE TABLET BY MOUTH ONCE DAILY 90 tablet 0  . nitroGLYCERIN (NITROSTAT) 0.4 MG SL tablet Place 1 tablet (0.4 mg total) under the tongue every 5 (five) minutes x 3 doses as needed for chest pain. 25 tablet 2  . omeprazole (PRILOSEC) 20 MG capsule Take 20 mg by mouth daily.      No current facility-administered medications for this visit.      Past Surgical History:  Procedure Laterality Date  . BREAST SURGERY Right    lumpectomy  . CHOLECYSTECTOMY  04/20178   [facility]  . LEFT HEART CATHETERIZATION WITH CORONARY ANGIOGRAM N/A 03/02/2014   Procedure: LEFT HEART CATHETERIZATION WITH CORONARY ANGIOGRAM;  Surgeon: Jettie Booze, MD;  Location: [facility] CATH LAB;  Service: Cardiovascular;  Laterality: N/A;  . PERCUTANEOUS CORONARY STENT INTERVENTION (PCI-S)  03/02/2014   Procedure: PERCUTANEOUS CORONARY STENT INTERVENTION (PCI-S);  Surgeon: Jettie Booze, MD;  Location: [facility] CATH LAB;  Service: Cardiovascular;;     Allergies  Allergen Reactions  . Codeine Nausea Only  . Erythromycin Nausea And Vomiting  . Nsaids Nausea Only  . Sulfa Antibiotics Other (See Comments)  Dry mouth   . Penicillins Itching and Rash      Family History  Problem Relation Age of Onset  . Coronary artery disease Sister 29       MI     Social History Jodi Spencer reports that she quit smoking about 19 years ago. Her smoking use included cigarettes. She started smoking about 64 years ago. She has a 90.00 pack-year smoking history. she has never used smokeless tobacco. Jodi Spencer reports that she drinks about 1.2 oz of alcohol per week.   Review of Systems CONSTITUTIONAL: No weight loss, fever, chills, weakness or fatigue.  HEENT: Eyes: No visual loss, blurred  vision, double vision or yellow sclerae.No hearing loss, sneezing, congestion, runny nose or sore throat.  SKIN: No rash or itching.  CARDIOVASCULAR: per hpi RESPIRATORY: per hpi  GASTROINTESTINAL: No anorexia, nausea, vomiting or diarrhea. No abdominal pain or blood.  GENITOURINARY: No burning on urination, no polyuria NEUROLOGICAL: No headache, dizziness, syncope, paralysis, ataxia, numbness or tingling in the extremities. No change in bowel or bladder control.  MUSCULOSKELETAL: No muscle, back pain, joint pain or stiffness.  LYMPHATICS: No enlarged nodes. No history of splenectomy.  PSYCHIATRIC: No history of depression or anxiety.  ENDOCRINOLOGIC: No reports of sweating, cold or heat intolerance. No polyuria or polydipsia.  Marland Kitchen   Physical Examination Vitals:   12/20/17 1556  BP: 138/80  Pulse: (!) 57  SpO2: 96%   Vitals:   12/20/17 1556  Weight: 141 lb 9.6 oz (64.2 kg)  Height: 5\' 2"  (1.575 m)    Gen: resting comfortably, no acute distress HEENT: no scleral icterus, pupils equal round and reactive, no palptable cervical adenopathy,  CV: RRR, no mr/g, no jvd Resp: Clear to auscultation bilaterally GI: abdomen is soft, non-tender, non-distended, normal bowel sounds, no hepatosplenomegaly MSK: extremities are warm, no edema.  Skin: warm, no rash Neuro:  no focal deficits Psych: appropriate affect   Diagnostic Studies  02/2014 Cath HEMODYNAMICS:Aortic pressure was 125/61; LV pressure was 127/4; LVEDP 12. There was no gradient between the left ventricle and aorta.  ANGIOGRAPHIC DATA:The left main coronary artery is widely patent.  The left anterior descending artery is a large vessel which reaches the apex. There is a moderate proximal to mid stenosis which is diffuse. The most severe portion appears to be 40-50%. The first diagonal is small and has a mild ostial stenosis. The second diagonal is large and widely patent.  The left circumflex artery is a large vessel  proximally. There are 2 small OM vessels. The third OM is the largest and there is a mild stenosis at the bifurcation. There is a large ramus vessel.  The right coronary artery is a large dominant vessel. There is a moderate area of disease in the mid portion followed by a 99% thrombotic stenosis. The posterolateral artery and PDA are large and patent. The ostial PDA has a mild stenosis.  LEFT VENTRICULOGRAM:Left ventricular angiogram was not done in the 30 RAO projection. LVEDP was 12 mmHg.  PCI NARRATIVE: A JR 4 Guide catheter was used. IV heparin and tirofiban were used. ACT was used to check that the anticoagulation was therapeutic. A prowater wire was placed across the lesion. A 2.5 x 15 balloon was used to predilate. There was visible thrombus in the mid RCA post balloon. An aspiration catheter was successfully used to remove the thrombus. A 3.0 x 23 Xience DES was deployed and postdilated with a 3.5 x 15 [place] balloon. There was an excellent  angiographic result with no residual stenosis. IC NTG was used.  IMPRESSIONS:  Normal left main coronary artery.  Moderate proximal to mid disease in the left anterior descending artery.  Mild disease in the left circumflex artery and its branches.  99% thrombotic lesion in the mid right coronary artery, successfully treated with a 3.0 x 23 Xience Drug eluting stent, postdilated to 3.5 mm in diameter.  Left ventricular systolic function not assessed. LVEDP 12 mmHg. RECOMMENDATION:Continue dual antiplatelet therapy for at least a year. She will need aggressive secondary prevention. She will need cardiology f/u given her moderate LAD disease as well.   03/01/14 Echo Study Conclusions  - Left ventricle: The cavity size was normal. Wall thickness was increased in a pattern of mild LVH. Systolic function was normal. The estimated ejection fraction was in the range of 50% to 55%. Wall motion was normal; there were no regional wall motion  abnormalities. Doppler parameters are consistent with abnormal left ventricular relaxation (grade 1 diastolic dysfunction). - Right atrium: The atrium was mildly dilated.  12/2010 PFTs Moderate COPD   10/2015 Nuclear stress test  The study is normal.  This is a low risk study.  Nuclear stress EF: 66%.       Assessment and Plan  1. CAD  - recent chest pain symptoms - she is not interested in a stress test at this time, but is open to a cardiac CTA. She would like to wait a few months before having done. Historically she has been very independent on what she chooses to take medication wise and what tests she chooses to have done. She understands the potential risk in postponing this test but wishes to schedule a few months from now.    2. Hyperlipidemia  - intolerant to several statins as well as zetia - she is not interested in repatha - continue dietary modification          Arnoldo Lenis, M.D

## 2017-12-22 ENCOUNTER — Encounter: Payer: Self-pay | Admitting: *Deleted

## 2017-12-23 ENCOUNTER — Encounter: Payer: Self-pay | Admitting: Cardiology

## 2018-01-06 ENCOUNTER — Encounter: Payer: Self-pay | Admitting: Cardiology

## 2018-01-13 DIAGNOSIS — H353211 Exudative age-related macular degeneration, right eye, with active choroidal neovascularization: Secondary | ICD-10-CM | POA: Diagnosis not present

## 2018-01-13 DIAGNOSIS — H353122 Nonexudative age-related macular degeneration, left eye, intermediate dry stage: Secondary | ICD-10-CM | POA: Diagnosis not present

## 2018-01-28 ENCOUNTER — Other Ambulatory Visit: Payer: Self-pay | Admitting: Cardiology

## 2018-01-28 ENCOUNTER — Telehealth: Payer: Self-pay | Admitting: *Deleted

## 2018-01-28 MED ORDER — LOSARTAN POTASSIUM 100 MG PO TABS: 100.0000 mg | ORAL_TABLET | Freq: Every day | ORAL | 3 refills | Status: DC

## 2018-01-28 MED ORDER — HYDROCHLOROTHIAZIDE 12.5 MG PO CAPS: 12.5000 mg | ORAL_CAPSULE | Freq: Every day | ORAL | 3 refills | Status: DC

## 2018-01-28 NOTE — Telephone Encounter (Signed)
Received fax from [facility] - stating Losartan/HCTZ 100-12.5mg  is on back order - requesting to split the 2 medications & send new refill.    New medications sent to pharmacy.

## 2018-02-23 ENCOUNTER — Ambulatory Visit: Payer: Medicare HMO | Admitting: Cardiology

## 2018-02-23 NOTE — Progress Notes (Deleted)
Clinical Summary Ms. Mcqueary is a 77 y.o.female 1. CAD  - NSTEMI 02/28/14, troponin peaked at 1.57  - cath showed LM patent, LAD moderate prox to mid disease, mild LCX disease, 99% RCA lesion that received a DES.  - echo LVEF 50-55%  - 10/2015 nuclear stress test without evidence for ischemia    - reports recent squeezing like feeling entire chest, can occur at rest. Can get shaky. Lasts just a few seconds.  - difficultly moving arms - similar to prior MI - increasing in frequency, now occurring weekly - sedentary lifestyle. Some DOE with her daily activities - compliant with meds  2. Hyperlipidemia  - she has been intolerant to multiple statins, most recently pravastatin. - last visit we tried starting zetia. Zetia caused muscle aches and she stopped taking. - she has not been interested in repatha   3. ? Aneurysm - possible aneurysm noted on CT A/P at [facility] 02/2015. Report indicates questionable venous varix vs 35mm thrombosed saccular anerusym. Patient has not wanted repeat imaging.   4. COPD - recent coughing and wheezing. Followed by pcp    SH: Daughter [name] is also a patient of mine    Past Medical History:  Diagnosis Date  . Fibromyalgia   . GERD (gastroesophageal reflux disease)      Allergies  Allergen Reactions  . Codeine Nausea Only  . Erythromycin Nausea And Vomiting  . Nsaids Nausea Only  . Sulfa Antibiotics Other (See Comments)    Dry mouth   . Penicillins Itching and Rash     Current Outpatient Medications  Medication Sig Dispense Refill  . Acetaminophen (TYLENOL ARTHRITIS PAIN PO) Take 2 tablets by mouth at bedtime.     . ALPRAZolam (XANAX) 1 MG tablet Take 0.5 tablets by mouth at bedtime as needed.    Marland Kitchen amLODipine (NORVASC) 5 MG tablet Take 1 tablet (5 mg total) by mouth daily. 90 tablet 3  . aspirin 81 MG tablet Take 81 mg by mouth daily.    . Bilberry 100 MG CAPS Take 1 capsule by mouth daily.    Marland Kitchen docusate  sodium (COLACE) 100 MG capsule Take 100 mg by mouth daily.    . hydrochlorothiazide (MICROZIDE) 12.5 MG capsule Take 1 capsule (12.5 mg total) by mouth daily. 90 capsule 3  . losartan (COZAAR) 100 MG tablet Take 1 tablet (100 mg total) by mouth daily. 90 tablet 3  . Multiple Vitamins-Minerals (PRESERVISION AREDS PO) Take 1 tablet by mouth daily.    . nitroGLYCERIN (NITROSTAT) 0.4 MG SL tablet Place 1 tablet (0.4 mg total) under the tongue every 5 (five) minutes x 3 doses as needed for chest pain. 25 tablet 2  . omeprazole (PRILOSEC) 20 MG capsule Take 20 mg by mouth daily.      No current facility-administered medications for this visit.      Past Surgical History:  Procedure Laterality Date  . BREAST SURGERY Right    lumpectomy  . CHOLECYSTECTOMY  04/20178   [facility]  . LEFT HEART CATHETERIZATION WITH CORONARY ANGIOGRAM N/A 03/02/2014   Procedure: LEFT HEART CATHETERIZATION WITH CORONARY ANGIOGRAM;  Surgeon: Jettie Booze, MD;  Location: [facility] CATH LAB;  Service: Cardiovascular;  Laterality: N/A;  . PERCUTANEOUS CORONARY STENT INTERVENTION (PCI-S)  03/02/2014   Procedure: PERCUTANEOUS CORONARY STENT INTERVENTION (PCI-S);  Surgeon: Jettie Booze, MD;  Location: [facility] CATH LAB;  Service: Cardiovascular;;     Allergies  Allergen Reactions  . Codeine Nausea Only  .  Erythromycin Nausea And Vomiting  . Nsaids Nausea Only  . Sulfa Antibiotics Other (See Comments)    Dry mouth   . Penicillins Itching and Rash      Family History  Problem Relation Age of Onset  . Coronary artery disease Sister 74       MI     Social History Ms. Beverley reports that she quit smoking about 19 years ago. Her smoking use included cigarettes. She started smoking about 64 years ago. She has a 90.00 pack-year smoking history. She has never used smokeless tobacco. Ms. Patane reports that she drinks about 1.2 oz of alcohol per week.   Review of Systems CONSTITUTIONAL: No weight loss, fever, chills,  weakness or fatigue.  HEENT: Eyes: No visual loss, blurred vision, double vision or yellow sclerae.No hearing loss, sneezing, congestion, runny nose or sore throat.  SKIN: No rash or itching.  CARDIOVASCULAR:  RESPIRATORY: No shortness of breath, cough or sputum.  GASTROINTESTINAL: No anorexia, nausea, vomiting or diarrhea. No abdominal pain or blood.  GENITOURINARY: No burning on urination, no polyuria NEUROLOGICAL: No headache, dizziness, syncope, paralysis, ataxia, numbness or tingling in the extremities. No change in bowel or bladder control.  MUSCULOSKELETAL: No muscle, back pain, joint pain or stiffness.  LYMPHATICS: No enlarged nodes. No history of splenectomy.  PSYCHIATRIC: No history of depression or anxiety.  ENDOCRINOLOGIC: No reports of sweating, cold or heat intolerance. No polyuria or polydipsia.  Marland Kitchen   Physical Examination There were no vitals filed for this visit. There were no vitals filed for this visit.  Gen: resting comfortably, no acute distress HEENT: no scleral icterus, pupils equal round and reactive, no palptable cervical adenopathy,  CV Resp: Clear to auscultation bilaterally GI: abdomen is soft, non-tender, non-distended, normal bowel sounds, no hepatosplenomegaly MSK: extremities are warm, no edema.  Skin: warm, no rash Neuro:  no focal deficits Psych: appropriate affect   Diagnostic Studies 02/2014 Cath HEMODYNAMICS:Aortic pressure was 125/61; LV pressure was 127/4; LVEDP 12. There was no gradient between the left ventricle and aorta.  ANGIOGRAPHIC DATA:The left main coronary artery is widely patent.  The left anterior descending artery is a large vessel which reaches the apex. There is a moderate proximal to mid stenosis which is diffuse. The most severe portion appears to be 40-50%. The first diagonal is small and has a mild ostial stenosis. The second diagonal is large and widely patent.  The left circumflex artery is a large vessel proximally.  There are 2 small OM vessels. The third OM is the largest and there is a mild stenosis at the bifurcation. There is a large ramus vessel.  The right coronary artery is a large dominant vessel. There is a moderate area of disease in the mid portion followed by a 99% thrombotic stenosis. The posterolateral artery and PDA are large and patent. The ostial PDA has a mild stenosis.  LEFT VENTRICULOGRAM:Left ventricular angiogram was not done in the 30 RAO projection. LVEDP was 12 mmHg.  PCI NARRATIVE: A JR 4 Guide catheter was used. IV heparin and tirofiban were used. ACT was used to check that the anticoagulation was therapeutic. A prowater wire was placed across the lesion. A 2.5 x 15 balloon was used to predilate. There was visible thrombus in the mid RCA post balloon. An aspiration catheter was successfully used to remove the thrombus. A 3.0 x 23 Xience DES was deployed and postdilated with a 3.5 x 15 [place] balloon. There was an excellent angiographic result with no residual  stenosis. IC NTG was used.  IMPRESSIONS:  Normal left main coronary artery.  Moderate proximal to mid disease in the left anterior descending artery.  Mild disease in the left circumflex artery and its branches.  99% thrombotic lesion in the mid right coronary artery, successfully treated with a 3.0 x 23 Xience Drug eluting stent, postdilated to 3.5 mm in diameter.  Left ventricular systolic function not assessed. LVEDP 12 mmHg. RECOMMENDATION:Continue dual antiplatelet therapy for at least a year. She will need aggressive secondary prevention. She will need cardiology f/u given her moderate LAD disease as well.   03/01/14 Echo Study Conclusions  - Left ventricle: The cavity size was normal. Wall thickness was increased in a pattern of mild LVH. Systolic function was normal. The estimated ejection fraction was in the range of 50% to 55%. Wall motion was normal; there were no regional wall motion abnormalities.  Doppler parameters are consistent with abnormal left ventricular relaxation (grade 1 diastolic dysfunction). - Right atrium: The atrium was mildly dilated.  12/2010 PFTs Moderate COPD   10/2015 Nuclear stress test  The study is normal.  This is a low risk study.  Nuclear stress EF: 66%.       Assessment and Plan  1. CAD  - recent chest pain symptoms - she is not interested in a stress test at this time, but is open to a cardiac CTA. She would like to wait a few months before having done. Historically she has been very independent on what she chooses to take medication wise and what tests she chooses to have done. She understands the potential risk in postponing this test but wishes to schedule a few months from now.    2. Hyperlipidemia  - intolerant to several statins as well as zetia - she is not interested in repatha - continue dietary modification        Arnoldo Lenis, M.D., F.A.C.C.

## 2018-03-02 DIAGNOSIS — R69 Illness, unspecified: Secondary | ICD-10-CM | POA: Diagnosis not present

## 2018-03-02 DIAGNOSIS — E782 Mixed hyperlipidemia: Secondary | ICD-10-CM | POA: Diagnosis not present

## 2018-03-02 DIAGNOSIS — M542 Cervicalgia: Secondary | ICD-10-CM | POA: Diagnosis not present

## 2018-03-02 DIAGNOSIS — I10 Essential (primary) hypertension: Secondary | ICD-10-CM | POA: Diagnosis not present

## 2018-03-02 DIAGNOSIS — M545 Low back pain: Secondary | ICD-10-CM | POA: Diagnosis not present

## 2018-03-02 DIAGNOSIS — I2510 Atherosclerotic heart disease of native coronary artery without angina pectoris: Secondary | ICD-10-CM | POA: Diagnosis not present

## 2018-03-02 DIAGNOSIS — Z6825 Body mass index (BMI) 25.0-25.9, adult: Secondary | ICD-10-CM | POA: Diagnosis not present

## 2018-03-02 DIAGNOSIS — K219 Gastro-esophageal reflux disease without esophagitis: Secondary | ICD-10-CM | POA: Diagnosis not present

## 2018-04-08 DIAGNOSIS — H353211 Exudative age-related macular degeneration, right eye, with active choroidal neovascularization: Secondary | ICD-10-CM | POA: Diagnosis not present

## 2018-05-03 DIAGNOSIS — K219 Gastro-esophageal reflux disease without esophagitis: Secondary | ICD-10-CM | POA: Diagnosis not present

## 2018-05-03 DIAGNOSIS — J449 Chronic obstructive pulmonary disease, unspecified: Secondary | ICD-10-CM | POA: Diagnosis not present

## 2018-05-03 DIAGNOSIS — M25562 Pain in left knee: Secondary | ICD-10-CM | POA: Diagnosis not present

## 2018-05-03 DIAGNOSIS — I209 Angina pectoris, unspecified: Secondary | ICD-10-CM | POA: Diagnosis not present

## 2018-05-03 DIAGNOSIS — Z6824 Body mass index (BMI) 24.0-24.9, adult: Secondary | ICD-10-CM | POA: Diagnosis not present

## 2018-05-06 DIAGNOSIS — Z955 Presence of coronary angioplasty implant and graft: Secondary | ICD-10-CM | POA: Diagnosis not present

## 2018-05-06 DIAGNOSIS — J441 Chronic obstructive pulmonary disease with (acute) exacerbation: Secondary | ICD-10-CM | POA: Diagnosis not present

## 2018-05-06 DIAGNOSIS — R05 Cough: Secondary | ICD-10-CM | POA: Diagnosis not present

## 2018-05-06 DIAGNOSIS — E7800 Pure hypercholesterolemia, unspecified: Secondary | ICD-10-CM | POA: Diagnosis not present

## 2018-05-06 DIAGNOSIS — Z87891 Personal history of nicotine dependence: Secondary | ICD-10-CM | POA: Diagnosis not present

## 2018-05-06 DIAGNOSIS — Z79899 Other long term (current) drug therapy: Secondary | ICD-10-CM | POA: Diagnosis not present

## 2018-05-06 DIAGNOSIS — I10 Essential (primary) hypertension: Secondary | ICD-10-CM | POA: Diagnosis not present

## 2018-05-06 DIAGNOSIS — Z951 Presence of aortocoronary bypass graft: Secondary | ICD-10-CM | POA: Diagnosis not present

## 2018-05-06 DIAGNOSIS — Z7982 Long term (current) use of aspirin: Secondary | ICD-10-CM | POA: Diagnosis not present

## 2018-05-06 DIAGNOSIS — R69 Illness, unspecified: Secondary | ICD-10-CM | POA: Diagnosis not present

## 2018-05-06 DIAGNOSIS — M797 Fibromyalgia: Secondary | ICD-10-CM | POA: Diagnosis not present

## 2018-05-10 DIAGNOSIS — J441 Chronic obstructive pulmonary disease with (acute) exacerbation: Secondary | ICD-10-CM | POA: Diagnosis not present

## 2018-05-10 DIAGNOSIS — Z6824 Body mass index (BMI) 24.0-24.9, adult: Secondary | ICD-10-CM | POA: Diagnosis not present

## 2018-05-10 DIAGNOSIS — R69 Illness, unspecified: Secondary | ICD-10-CM | POA: Diagnosis not present

## 2018-05-10 DIAGNOSIS — E876 Hypokalemia: Secondary | ICD-10-CM | POA: Diagnosis not present

## 2018-06-06 DIAGNOSIS — H353211 Exudative age-related macular degeneration, right eye, with active choroidal neovascularization: Secondary | ICD-10-CM | POA: Diagnosis not present

## 2018-06-06 DIAGNOSIS — H353122 Nonexudative age-related macular degeneration, left eye, intermediate dry stage: Secondary | ICD-10-CM | POA: Diagnosis not present

## 2018-08-02 DIAGNOSIS — M47816 Spondylosis without myelopathy or radiculopathy, lumbar region: Secondary | ICD-10-CM | POA: Diagnosis not present

## 2018-08-02 DIAGNOSIS — M47814 Spondylosis without myelopathy or radiculopathy, thoracic region: Secondary | ICD-10-CM | POA: Diagnosis not present

## 2018-08-08 DIAGNOSIS — Z961 Presence of intraocular lens: Secondary | ICD-10-CM | POA: Diagnosis not present

## 2018-08-08 DIAGNOSIS — H353211 Exudative age-related macular degeneration, right eye, with active choroidal neovascularization: Secondary | ICD-10-CM | POA: Diagnosis not present

## 2018-08-08 DIAGNOSIS — H353122 Nonexudative age-related macular degeneration, left eye, intermediate dry stage: Secondary | ICD-10-CM | POA: Diagnosis not present

## 2018-08-19 DIAGNOSIS — R69 Illness, unspecified: Secondary | ICD-10-CM | POA: Diagnosis not present

## 2018-08-29 DIAGNOSIS — I2510 Atherosclerotic heart disease of native coronary artery without angina pectoris: Secondary | ICD-10-CM | POA: Diagnosis not present

## 2018-08-29 DIAGNOSIS — R69 Illness, unspecified: Secondary | ICD-10-CM | POA: Diagnosis not present

## 2018-08-29 DIAGNOSIS — I10 Essential (primary) hypertension: Secondary | ICD-10-CM | POA: Diagnosis not present

## 2018-08-29 DIAGNOSIS — Z6825 Body mass index (BMI) 25.0-25.9, adult: Secondary | ICD-10-CM | POA: Diagnosis not present

## 2018-08-29 DIAGNOSIS — M542 Cervicalgia: Secondary | ICD-10-CM | POA: Diagnosis not present

## 2018-08-29 DIAGNOSIS — E782 Mixed hyperlipidemia: Secondary | ICD-10-CM | POA: Diagnosis not present

## 2018-08-29 DIAGNOSIS — M545 Low back pain: Secondary | ICD-10-CM | POA: Diagnosis not present

## 2018-08-29 DIAGNOSIS — K219 Gastro-esophageal reflux disease without esophagitis: Secondary | ICD-10-CM | POA: Diagnosis not present

## 2018-09-05 DIAGNOSIS — H353211 Exudative age-related macular degeneration, right eye, with active choroidal neovascularization: Secondary | ICD-10-CM | POA: Diagnosis not present

## 2018-09-13 ENCOUNTER — Ambulatory Visit (INDEPENDENT_AMBULATORY_CARE_PROVIDER_SITE_OTHER): Payer: Medicare HMO | Admitting: Internal Medicine

## 2018-09-14 ENCOUNTER — Ambulatory Visit (INDEPENDENT_AMBULATORY_CARE_PROVIDER_SITE_OTHER): Payer: Medicare HMO | Admitting: Internal Medicine

## 2018-09-14 ENCOUNTER — Encounter (INDEPENDENT_AMBULATORY_CARE_PROVIDER_SITE_OTHER): Payer: Self-pay | Admitting: Internal Medicine

## 2018-09-14 VITALS — BP 158/86 | HR 64 | Temp 98.0°F | Ht 62.0 in | Wt 138.1 lb

## 2018-09-14 DIAGNOSIS — K5909 Other constipation: Secondary | ICD-10-CM | POA: Diagnosis not present

## 2018-09-14 NOTE — Patient Instructions (Signed)
Stop the Fiber therapy. Start MIralax daily. She will OV 3 months

## 2018-09-14 NOTE — Progress Notes (Addendum)
Subjective:    Patient ID: Jodi Spencer, female    DOB: 03-Feb-1941, 77 y.o.   MRN: [id number]  HPI Referred by K. Skillman PA-C for constipation. She says her BMs are sticky.  Her last colonoscopy was in May of 2016 by Dr. Britta Mccreedy. Retroflexed: no abnormalities. Biopsy: Localized adenomatous change in colonic epithelium.  Has a BM on average x 1-2 a week. She says she drinks a lot of water. She takes a stool softener and fiber at night. No melena or BRRB. Has not tried other medications for constipation. Has never taken any Miralax. Her appetite is okay. No weight loss.   Cardiac stent.  Review of Systems Past Medical History:  Diagnosis Date  . Fibromyalgia   . GERD (gastroesophageal reflux disease)     Past Surgical History:  Procedure Laterality Date  . BREAST SURGERY Right    lumpectomy  . CHOLECYSTECTOMY  04/20178   [facility]  . LEFT HEART CATHETERIZATION WITH CORONARY ANGIOGRAM N/A 03/02/2014   Procedure: LEFT HEART CATHETERIZATION WITH CORONARY ANGIOGRAM;  Surgeon: Jettie Booze, MD;  Location: [facility] CATH LAB;  Service: Cardiovascular;  Laterality: N/A;  . PERCUTANEOUS CORONARY STENT INTERVENTION (PCI-S)  03/02/2014   Procedure: PERCUTANEOUS CORONARY STENT INTERVENTION (PCI-S);  Surgeon: Jettie Booze, MD;  Location: [facility] CATH LAB;  Service: Cardiovascular;;    Allergies  Allergen Reactions  . Codeine Nausea Only  . Erythromycin Nausea And Vomiting  . Nsaids Nausea Only  . Sulfa Antibiotics Other (See Comments)    Dry mouth   . Penicillins Itching and Rash    Current Outpatient Medications on File Prior to Visit  Medication Sig Dispense Refill  . Acetaminophen (TYLENOL ARTHRITIS PAIN PO) Take 2 tablets by mouth at bedtime.     . ALPRAZolam (XANAX) 1 MG tablet Take 0.5 tablets by mouth at bedtime as needed.    Marland Kitchen amLODipine (NORVASC) 5 MG tablet Take 1 tablet (5 mg total) by mouth daily. 90 tablet 3  . aspirin 81 MG tablet Take 81 mg by mouth daily.    .  Bilberry 100 MG CAPS Take 1 capsule by mouth daily.    Marland Kitchen docusate sodium (COLACE) 100 MG capsule Take 100 mg by mouth daily.    Marland Kitchen losartan-hydrochlorothiazide (HYZAAR) 100-12.5 MG tablet Take 1 tablet by mouth daily.    . methylcellulose oral powder Take by mouth daily.    . Multiple Vitamins-Minerals (HAIR SKIN AND NAILS FORMULA PO) Take by mouth.    . Multiple Vitamins-Minerals (PRESERVISION AREDS PO) Take 1 tablet by mouth daily.    . nitroGLYCERIN (NITROSTAT) 0.4 MG SL tablet Place 1 tablet (0.4 mg total) under the tongue every 5 (five) minutes x 3 doses as needed for chest pain. 25 tablet 2  . omeprazole (PRILOSEC) 20 MG capsule Take 20 mg by mouth daily.     . Potassium 99 MG TABS Take by mouth.    . Prenatal Multivit-Min-Fe-FA (PRENATAL VITAMINS PO) Take by mouth.    . hydrochlorothiazide (MICROZIDE) 12.5 MG capsule Take 1 capsule (12.5 mg total) by mouth daily. 90 capsule 3  . losartan (COZAAR) 100 MG tablet Take 1 tablet (100 mg total) by mouth daily. 90 tablet 3   No current facility-administered medications on file prior to visit.         Objective:   Physical Exam Blood pressure (!) 158/86, pulse 64, temperature 98 F (36.7 C), height 5\' 2"  (1.575 m), weight 138 lb 1.6 oz (62.6 kg). Alert and oriented.  Skin warm and dry. Oral mucosa is moist.   . Sclera anicteric, conjunctivae is pink. Thyroid not enlarged. No cervical lymphadenopathy. Lungs clear. Heart regular slightly irregular.   Abdomen is soft. Bowel sounds are positive. No hepatomegaly. No abdominal masses felt. No tenderness.  No edema to lower extremities.         Assessment & Plan:  Chronic constipation. She will start the Miralax. She will stop the fiber therapy. She will have OV in 3 months.

## 2018-10-05 DIAGNOSIS — I2510 Atherosclerotic heart disease of native coronary artery without angina pectoris: Secondary | ICD-10-CM | POA: Diagnosis not present

## 2018-12-09 DIAGNOSIS — H353122 Nonexudative age-related macular degeneration, left eye, intermediate dry stage: Secondary | ICD-10-CM | POA: Diagnosis not present

## 2018-12-09 DIAGNOSIS — H353211 Exudative age-related macular degeneration, right eye, with active choroidal neovascularization: Secondary | ICD-10-CM | POA: Diagnosis not present

## 2018-12-19 ENCOUNTER — Ambulatory Visit (INDEPENDENT_AMBULATORY_CARE_PROVIDER_SITE_OTHER): Payer: Medicare HMO | Admitting: Internal Medicine

## 2018-12-25 DIAGNOSIS — M47812 Spondylosis without myelopathy or radiculopathy, cervical region: Secondary | ICD-10-CM | POA: Diagnosis not present

## 2018-12-25 DIAGNOSIS — R55 Syncope and collapse: Secondary | ICD-10-CM | POA: Diagnosis not present

## 2018-12-25 DIAGNOSIS — M797 Fibromyalgia: Secondary | ICD-10-CM | POA: Diagnosis not present

## 2018-12-25 DIAGNOSIS — Z7982 Long term (current) use of aspirin: Secondary | ICD-10-CM | POA: Diagnosis not present

## 2018-12-25 DIAGNOSIS — Z955 Presence of coronary angioplasty implant and graft: Secondary | ICD-10-CM | POA: Diagnosis not present

## 2018-12-25 DIAGNOSIS — R0789 Other chest pain: Secondary | ICD-10-CM | POA: Diagnosis not present

## 2018-12-25 DIAGNOSIS — I10 Essential (primary) hypertension: Secondary | ICD-10-CM | POA: Diagnosis not present

## 2018-12-25 DIAGNOSIS — I252 Old myocardial infarction: Secondary | ICD-10-CM | POA: Diagnosis not present

## 2018-12-25 DIAGNOSIS — Z79899 Other long term (current) drug therapy: Secondary | ICD-10-CM | POA: Diagnosis not present

## 2018-12-25 DIAGNOSIS — R110 Nausea: Secondary | ICD-10-CM | POA: Diagnosis not present

## 2018-12-25 DIAGNOSIS — R000 Tachycardia, unspecified: Secondary | ICD-10-CM | POA: Diagnosis not present

## 2018-12-25 DIAGNOSIS — Z87891 Personal history of nicotine dependence: Secondary | ICD-10-CM | POA: Diagnosis not present

## 2019-01-02 DIAGNOSIS — L820 Inflamed seborrheic keratosis: Secondary | ICD-10-CM | POA: Diagnosis not present

## 2019-01-02 DIAGNOSIS — L218 Other seborrheic dermatitis: Secondary | ICD-10-CM | POA: Diagnosis not present

## 2019-01-02 DIAGNOSIS — L0202 Furuncle of face: Secondary | ICD-10-CM | POA: Diagnosis not present

## 2019-01-03 DIAGNOSIS — R7989 Other specified abnormal findings of blood chemistry: Secondary | ICD-10-CM | POA: Diagnosis not present

## 2019-01-03 DIAGNOSIS — K219 Gastro-esophageal reflux disease without esophagitis: Secondary | ICD-10-CM | POA: Diagnosis not present

## 2019-01-03 DIAGNOSIS — I10 Essential (primary) hypertension: Secondary | ICD-10-CM | POA: Diagnosis not present

## 2019-01-03 DIAGNOSIS — E785 Hyperlipidemia, unspecified: Secondary | ICD-10-CM | POA: Diagnosis not present

## 2019-01-03 DIAGNOSIS — M542 Cervicalgia: Secondary | ICD-10-CM | POA: Diagnosis not present

## 2019-01-03 DIAGNOSIS — R946 Abnormal results of thyroid function studies: Secondary | ICD-10-CM | POA: Diagnosis not present

## 2019-01-03 DIAGNOSIS — I2510 Atherosclerotic heart disease of native coronary artery without angina pectoris: Secondary | ICD-10-CM | POA: Diagnosis not present

## 2019-01-03 DIAGNOSIS — Z6825 Body mass index (BMI) 25.0-25.9, adult: Secondary | ICD-10-CM | POA: Diagnosis not present

## 2019-01-03 DIAGNOSIS — R6889 Other general symptoms and signs: Secondary | ICD-10-CM | POA: Diagnosis not present

## 2019-01-03 DIAGNOSIS — J029 Acute pharyngitis, unspecified: Secondary | ICD-10-CM | POA: Diagnosis not present

## 2019-01-18 DIAGNOSIS — R5383 Other fatigue: Secondary | ICD-10-CM | POA: Diagnosis not present

## 2019-01-18 DIAGNOSIS — I2510 Atherosclerotic heart disease of native coronary artery without angina pectoris: Secondary | ICD-10-CM | POA: Diagnosis not present

## 2019-01-18 DIAGNOSIS — I714 Abdominal aortic aneurysm, without rupture: Secondary | ICD-10-CM | POA: Diagnosis not present

## 2019-02-23 DIAGNOSIS — L280 Lichen simplex chronicus: Secondary | ICD-10-CM | POA: Diagnosis not present

## 2019-02-23 DIAGNOSIS — L719 Rosacea, unspecified: Secondary | ICD-10-CM | POA: Diagnosis not present

## 2019-03-09 DIAGNOSIS — L710 Perioral dermatitis: Secondary | ICD-10-CM | POA: Diagnosis not present

## 2019-03-09 DIAGNOSIS — L309 Dermatitis, unspecified: Secondary | ICD-10-CM | POA: Diagnosis not present

## 2019-05-18 DIAGNOSIS — R51 Headache: Secondary | ICD-10-CM | POA: Diagnosis not present

## 2019-05-18 DIAGNOSIS — K219 Gastro-esophageal reflux disease without esophagitis: Secondary | ICD-10-CM | POA: Diagnosis not present

## 2019-05-18 DIAGNOSIS — R202 Paresthesia of skin: Secondary | ICD-10-CM | POA: Diagnosis not present

## 2019-05-18 DIAGNOSIS — M542 Cervicalgia: Secondary | ICD-10-CM | POA: Diagnosis not present

## 2019-05-18 DIAGNOSIS — I10 Essential (primary) hypertension: Secondary | ICD-10-CM | POA: Diagnosis not present

## 2019-05-18 DIAGNOSIS — R69 Illness, unspecified: Secondary | ICD-10-CM | POA: Diagnosis not present

## 2019-05-18 DIAGNOSIS — I2510 Atherosclerotic heart disease of native coronary artery without angina pectoris: Secondary | ICD-10-CM | POA: Diagnosis not present

## 2019-05-18 DIAGNOSIS — Z6825 Body mass index (BMI) 25.0-25.9, adult: Secondary | ICD-10-CM | POA: Diagnosis not present

## 2019-05-18 DIAGNOSIS — E782 Mixed hyperlipidemia: Secondary | ICD-10-CM | POA: Diagnosis not present

## 2019-05-24 DIAGNOSIS — R42 Dizziness and giddiness: Secondary | ICD-10-CM | POA: Diagnosis not present

## 2019-05-24 DIAGNOSIS — R51 Headache: Secondary | ICD-10-CM | POA: Diagnosis not present

## 2019-08-02 DIAGNOSIS — R69 Illness, unspecified: Secondary | ICD-10-CM | POA: Diagnosis not present

## 2019-10-23 DIAGNOSIS — R197 Diarrhea, unspecified: Secondary | ICD-10-CM | POA: Diagnosis not present

## 2019-10-23 DIAGNOSIS — R110 Nausea: Secondary | ICD-10-CM | POA: Diagnosis not present

## 2019-11-08 DIAGNOSIS — E876 Hypokalemia: Secondary | ICD-10-CM | POA: Diagnosis not present

## 2019-11-08 DIAGNOSIS — K219 Gastro-esophageal reflux disease without esophagitis: Secondary | ICD-10-CM | POA: Diagnosis not present

## 2019-11-08 DIAGNOSIS — E782 Mixed hyperlipidemia: Secondary | ICD-10-CM | POA: Diagnosis not present

## 2019-11-08 DIAGNOSIS — J449 Chronic obstructive pulmonary disease, unspecified: Secondary | ICD-10-CM | POA: Diagnosis not present

## 2019-11-08 DIAGNOSIS — R5383 Other fatigue: Secondary | ICD-10-CM | POA: Diagnosis not present

## 2019-11-08 DIAGNOSIS — I10 Essential (primary) hypertension: Secondary | ICD-10-CM | POA: Diagnosis not present

## 2019-11-14 DIAGNOSIS — I10 Essential (primary) hypertension: Secondary | ICD-10-CM | POA: Diagnosis not present

## 2019-11-14 DIAGNOSIS — E782 Mixed hyperlipidemia: Secondary | ICD-10-CM | POA: Diagnosis not present

## 2019-11-14 DIAGNOSIS — K219 Gastro-esophageal reflux disease without esophagitis: Secondary | ICD-10-CM | POA: Diagnosis not present

## 2019-11-14 DIAGNOSIS — I2510 Atherosclerotic heart disease of native coronary artery without angina pectoris: Secondary | ICD-10-CM | POA: Diagnosis not present

## 2019-11-14 DIAGNOSIS — K5900 Constipation, unspecified: Secondary | ICD-10-CM | POA: Diagnosis not present

## 2019-11-14 DIAGNOSIS — M542 Cervicalgia: Secondary | ICD-10-CM | POA: Diagnosis not present

## 2019-11-14 DIAGNOSIS — Z6825 Body mass index (BMI) 25.0-25.9, adult: Secondary | ICD-10-CM | POA: Diagnosis not present

## 2020-01-01 DIAGNOSIS — H353211 Exudative age-related macular degeneration, right eye, with active choroidal neovascularization: Secondary | ICD-10-CM | POA: Diagnosis not present

## 2020-01-01 DIAGNOSIS — Z961 Presence of intraocular lens: Secondary | ICD-10-CM | POA: Diagnosis not present

## 2020-01-01 DIAGNOSIS — H524 Presbyopia: Secondary | ICD-10-CM | POA: Diagnosis not present

## 2020-01-01 DIAGNOSIS — H26493 Other secondary cataract, bilateral: Secondary | ICD-10-CM | POA: Diagnosis not present

## 2020-01-01 DIAGNOSIS — H5212 Myopia, left eye: Secondary | ICD-10-CM | POA: Diagnosis not present

## 2020-01-01 DIAGNOSIS — H353122 Nonexudative age-related macular degeneration, left eye, intermediate dry stage: Secondary | ICD-10-CM | POA: Diagnosis not present

## 2020-01-01 DIAGNOSIS — H52203 Unspecified astigmatism, bilateral: Secondary | ICD-10-CM | POA: Diagnosis not present

## 2020-01-01 DIAGNOSIS — H5201 Hypermetropia, right eye: Secondary | ICD-10-CM | POA: Diagnosis not present

## 2020-01-05 ENCOUNTER — Encounter (INDEPENDENT_AMBULATORY_CARE_PROVIDER_SITE_OTHER): Payer: Self-pay | Admitting: Ophthalmology

## 2020-01-11 DIAGNOSIS — H353211 Exudative age-related macular degeneration, right eye, with active choroidal neovascularization: Secondary | ICD-10-CM | POA: Diagnosis not present

## 2020-01-11 DIAGNOSIS — H353122 Nonexudative age-related macular degeneration, left eye, intermediate dry stage: Secondary | ICD-10-CM | POA: Diagnosis not present

## 2020-02-14 DIAGNOSIS — J449 Chronic obstructive pulmonary disease, unspecified: Secondary | ICD-10-CM | POA: Diagnosis not present

## 2020-02-14 DIAGNOSIS — I10 Essential (primary) hypertension: Secondary | ICD-10-CM | POA: Diagnosis not present

## 2020-02-14 DIAGNOSIS — K219 Gastro-esophageal reflux disease without esophagitis: Secondary | ICD-10-CM | POA: Diagnosis not present

## 2020-02-26 DIAGNOSIS — E785 Hyperlipidemia, unspecified: Secondary | ICD-10-CM | POA: Diagnosis not present

## 2020-02-26 DIAGNOSIS — I2510 Atherosclerotic heart disease of native coronary artery without angina pectoris: Secondary | ICD-10-CM | POA: Diagnosis not present

## 2020-02-26 DIAGNOSIS — R079 Chest pain, unspecified: Secondary | ICD-10-CM | POA: Diagnosis not present

## 2020-02-26 DIAGNOSIS — I10 Essential (primary) hypertension: Secondary | ICD-10-CM | POA: Diagnosis not present

## 2020-02-27 DIAGNOSIS — E785 Hyperlipidemia, unspecified: Secondary | ICD-10-CM | POA: Diagnosis not present

## 2020-03-06 ENCOUNTER — Other Ambulatory Visit: Payer: Self-pay

## 2020-03-06 DIAGNOSIS — R0789 Other chest pain: Secondary | ICD-10-CM

## 2020-03-06 DIAGNOSIS — R079 Chest pain, unspecified: Secondary | ICD-10-CM

## 2020-03-06 NOTE — Progress Notes (Signed)
Order placed for Coronary CT as requested by Dr Cathie Hoops, DO/ Dr Ena Dawley

## 2020-03-07 DIAGNOSIS — R079 Chest pain, unspecified: Secondary | ICD-10-CM | POA: Diagnosis not present

## 2020-03-07 DIAGNOSIS — I2510 Atherosclerotic heart disease of native coronary artery without angina pectoris: Secondary | ICD-10-CM | POA: Diagnosis not present

## 2020-03-15 DIAGNOSIS — I10 Essential (primary) hypertension: Secondary | ICD-10-CM | POA: Diagnosis not present

## 2020-03-15 DIAGNOSIS — K219 Gastro-esophageal reflux disease without esophagitis: Secondary | ICD-10-CM | POA: Diagnosis not present

## 2020-03-15 DIAGNOSIS — J449 Chronic obstructive pulmonary disease, unspecified: Secondary | ICD-10-CM | POA: Diagnosis not present

## 2020-03-18 DIAGNOSIS — J340 Abscess, furuncle and carbuncle of nose: Secondary | ICD-10-CM | POA: Diagnosis not present

## 2020-03-18 DIAGNOSIS — Z6825 Body mass index (BMI) 25.0-25.9, adult: Secondary | ICD-10-CM | POA: Diagnosis not present

## 2020-03-22 ENCOUNTER — Telehealth (HOSPITAL_COMMUNITY): Payer: Self-pay | Admitting: *Deleted

## 2020-03-22 NOTE — Telephone Encounter (Signed)
Attempted to call patient regarding upcoming cardiac CT appointment. Left message on voicemail with name and callback number Kristeena Meineke Tai RN Navigator Cardiac Imaging  Heart and Vascular Services [phone] Office [phone] Cell 

## 2020-03-25 ENCOUNTER — Telehealth (HOSPITAL_COMMUNITY): Payer: Self-pay | Admitting: Emergency Medicine

## 2020-03-25 ENCOUNTER — Ambulatory Visit (HOSPITAL_COMMUNITY): Admission: RE | Admit: 2020-03-25 | Payer: Medicare HMO | Source: Ambulatory Visit

## 2020-03-25 NOTE — Telephone Encounter (Signed)
Trying to call patient to inform her that her CCTA today will be cancelled due to prior stent placement in the RCA. This disqualifies her from being a good CTA candidate.    I called the home number, cell phone of both patient and husband and there was no answer on any phone.   I left messages on home phone and patients cell phone.  I will also call the [facility] Cardiology office to let them know  Marchia Bond RN Navigator Cardiac Imaging [facility] Heart and Vascular Services [phone] Office  [phone] Cell

## 2020-03-25 NOTE — Telephone Encounter (Signed)
[place] office calling to request CCTA still be completed despite patients history of stent placement in RCA. I have been unable to speak with Dr. Candis Musa directly, only through staff who answers the phone (takes notes and callback with further questions/instructions).   Discussion with cardiac imaging reader insists that test not be done as it will not produce a quality, diagnostic scan.   Attempted to call them back to provide his callback number however they are now closed for lunch, VM message was left.   Marchia Bond RN Navigator Cardiac Imaging [facility] Heart and Vascular Services [phone] Office  [phone] Cell

## 2020-04-15 DIAGNOSIS — I10 Essential (primary) hypertension: Secondary | ICD-10-CM | POA: Diagnosis not present

## 2020-04-15 DIAGNOSIS — E7849 Other hyperlipidemia: Secondary | ICD-10-CM | POA: Diagnosis not present

## 2020-04-15 DIAGNOSIS — Z87891 Personal history of nicotine dependence: Secondary | ICD-10-CM | POA: Diagnosis not present

## 2020-04-15 DIAGNOSIS — J441 Chronic obstructive pulmonary disease with (acute) exacerbation: Secondary | ICD-10-CM | POA: Diagnosis not present

## 2020-04-29 DIAGNOSIS — E782 Mixed hyperlipidemia: Secondary | ICD-10-CM | POA: Diagnosis not present

## 2020-04-29 DIAGNOSIS — Z0000 Encounter for general adult medical examination without abnormal findings: Secondary | ICD-10-CM | POA: Diagnosis not present

## 2020-04-29 DIAGNOSIS — K5900 Constipation, unspecified: Secondary | ICD-10-CM | POA: Diagnosis not present

## 2020-04-29 DIAGNOSIS — I2510 Atherosclerotic heart disease of native coronary artery without angina pectoris: Secondary | ICD-10-CM | POA: Diagnosis not present

## 2020-04-29 DIAGNOSIS — M542 Cervicalgia: Secondary | ICD-10-CM | POA: Diagnosis not present

## 2020-04-29 DIAGNOSIS — I10 Essential (primary) hypertension: Secondary | ICD-10-CM | POA: Diagnosis not present

## 2020-04-29 DIAGNOSIS — K219 Gastro-esophageal reflux disease without esophagitis: Secondary | ICD-10-CM | POA: Diagnosis not present

## 2020-04-29 DIAGNOSIS — Z6826 Body mass index (BMI) 26.0-26.9, adult: Secondary | ICD-10-CM | POA: Diagnosis not present

## 2020-05-15 DIAGNOSIS — J441 Chronic obstructive pulmonary disease with (acute) exacerbation: Secondary | ICD-10-CM | POA: Diagnosis not present

## 2020-05-15 DIAGNOSIS — E7849 Other hyperlipidemia: Secondary | ICD-10-CM | POA: Diagnosis not present

## 2020-05-15 DIAGNOSIS — I10 Essential (primary) hypertension: Secondary | ICD-10-CM | POA: Diagnosis not present

## 2020-05-15 DIAGNOSIS — Z87891 Personal history of nicotine dependence: Secondary | ICD-10-CM | POA: Diagnosis not present

## 2020-06-13 DIAGNOSIS — L570 Actinic keratosis: Secondary | ICD-10-CM | POA: Diagnosis not present

## 2020-06-13 DIAGNOSIS — L281 Prurigo nodularis: Secondary | ICD-10-CM | POA: Diagnosis not present

## 2020-06-13 DIAGNOSIS — D485 Neoplasm of uncertain behavior of skin: Secondary | ICD-10-CM | POA: Diagnosis not present

## 2020-07-16 DIAGNOSIS — J441 Chronic obstructive pulmonary disease with (acute) exacerbation: Secondary | ICD-10-CM | POA: Diagnosis not present

## 2020-07-16 DIAGNOSIS — Z87891 Personal history of nicotine dependence: Secondary | ICD-10-CM | POA: Diagnosis not present

## 2020-07-16 DIAGNOSIS — E7849 Other hyperlipidemia: Secondary | ICD-10-CM | POA: Diagnosis not present

## 2020-07-16 DIAGNOSIS — I10 Essential (primary) hypertension: Secondary | ICD-10-CM | POA: Diagnosis not present

## 2020-08-15 DIAGNOSIS — J441 Chronic obstructive pulmonary disease with (acute) exacerbation: Secondary | ICD-10-CM | POA: Diagnosis not present

## 2020-08-15 DIAGNOSIS — E7849 Other hyperlipidemia: Secondary | ICD-10-CM | POA: Diagnosis not present

## 2020-08-15 DIAGNOSIS — Z87891 Personal history of nicotine dependence: Secondary | ICD-10-CM | POA: Diagnosis not present

## 2020-08-15 DIAGNOSIS — I10 Essential (primary) hypertension: Secondary | ICD-10-CM | POA: Diagnosis not present

## 2020-09-02 DIAGNOSIS — K5900 Constipation, unspecified: Secondary | ICD-10-CM | POA: Diagnosis not present

## 2020-09-02 DIAGNOSIS — L0390 Cellulitis, unspecified: Secondary | ICD-10-CM | POA: Diagnosis not present

## 2020-09-14 DIAGNOSIS — I10 Essential (primary) hypertension: Secondary | ICD-10-CM | POA: Diagnosis not present

## 2020-09-14 DIAGNOSIS — Z87891 Personal history of nicotine dependence: Secondary | ICD-10-CM | POA: Diagnosis not present

## 2020-09-14 DIAGNOSIS — J441 Chronic obstructive pulmonary disease with (acute) exacerbation: Secondary | ICD-10-CM | POA: Diagnosis not present

## 2020-09-14 DIAGNOSIS — E7849 Other hyperlipidemia: Secondary | ICD-10-CM | POA: Diagnosis not present

## 2020-09-16 DIAGNOSIS — Z6826 Body mass index (BMI) 26.0-26.9, adult: Secondary | ICD-10-CM | POA: Diagnosis not present

## 2020-09-16 DIAGNOSIS — E782 Mixed hyperlipidemia: Secondary | ICD-10-CM | POA: Diagnosis not present

## 2020-09-16 DIAGNOSIS — K219 Gastro-esophageal reflux disease without esophagitis: Secondary | ICD-10-CM | POA: Diagnosis not present

## 2020-09-16 DIAGNOSIS — M542 Cervicalgia: Secondary | ICD-10-CM | POA: Diagnosis not present

## 2020-09-16 DIAGNOSIS — K5900 Constipation, unspecified: Secondary | ICD-10-CM | POA: Diagnosis not present

## 2020-09-16 DIAGNOSIS — I10 Essential (primary) hypertension: Secondary | ICD-10-CM | POA: Diagnosis not present

## 2020-09-16 DIAGNOSIS — Z23 Encounter for immunization: Secondary | ICD-10-CM | POA: Diagnosis not present

## 2020-09-16 DIAGNOSIS — I2510 Atherosclerotic heart disease of native coronary artery without angina pectoris: Secondary | ICD-10-CM | POA: Diagnosis not present

## 2020-10-23 ENCOUNTER — Encounter (INDEPENDENT_AMBULATORY_CARE_PROVIDER_SITE_OTHER): Payer: Self-pay | Admitting: Gastroenterology

## 2020-11-15 DIAGNOSIS — J441 Chronic obstructive pulmonary disease with (acute) exacerbation: Secondary | ICD-10-CM | POA: Diagnosis not present

## 2020-11-15 DIAGNOSIS — Z87891 Personal history of nicotine dependence: Secondary | ICD-10-CM | POA: Diagnosis not present

## 2020-11-15 DIAGNOSIS — I10 Essential (primary) hypertension: Secondary | ICD-10-CM | POA: Diagnosis not present

## 2020-11-15 DIAGNOSIS — E7849 Other hyperlipidemia: Secondary | ICD-10-CM | POA: Diagnosis not present

## 2020-12-14 DIAGNOSIS — Z87891 Personal history of nicotine dependence: Secondary | ICD-10-CM | POA: Diagnosis not present

## 2020-12-14 DIAGNOSIS — E7849 Other hyperlipidemia: Secondary | ICD-10-CM | POA: Diagnosis not present

## 2020-12-14 DIAGNOSIS — J441 Chronic obstructive pulmonary disease with (acute) exacerbation: Secondary | ICD-10-CM | POA: Diagnosis not present

## 2020-12-14 DIAGNOSIS — I10 Essential (primary) hypertension: Secondary | ICD-10-CM | POA: Diagnosis not present

## 2020-12-23 DIAGNOSIS — Z1231 Encounter for screening mammogram for malignant neoplasm of breast: Secondary | ICD-10-CM | POA: Diagnosis not present

## 2020-12-30 ENCOUNTER — Ambulatory Visit (INDEPENDENT_AMBULATORY_CARE_PROVIDER_SITE_OTHER): Payer: Medicare HMO | Admitting: Gastroenterology

## 2020-12-30 ENCOUNTER — Encounter (INDEPENDENT_AMBULATORY_CARE_PROVIDER_SITE_OTHER): Payer: Self-pay | Admitting: Gastroenterology

## 2020-12-30 ENCOUNTER — Other Ambulatory Visit: Payer: Self-pay

## 2020-12-30 DIAGNOSIS — K581 Irritable bowel syndrome with constipation: Secondary | ICD-10-CM

## 2020-12-30 DIAGNOSIS — K589 Irritable bowel syndrome without diarrhea: Secondary | ICD-10-CM | POA: Insufficient documentation

## 2020-12-30 MED ORDER — LINACLOTIDE 72 MCG PO CAPS: 72.0000 ug | ORAL_CAPSULE | Freq: Every day | ORAL | 3 refills | Status: DC

## 2020-12-30 NOTE — Patient Instructions (Signed)
Start Linzess 72 mcg qday Take kiwi daily

## 2020-12-30 NOTE — Progress Notes (Signed)
Jodi Spencer, M.D. Gastroenterology & Hepatology [facility] [address] [place], [place] [address]  Primary Care Physician: Rosine Door [address] [place]  [id number]  I will communicate my assessment and recommendations to the referring MD via EMR.  Problems: 1. IBS-C  History of Present Illness: Jodi Spencer is a 80 y.o. female with past medical history of IBS-C, fibromyalgia and GERD, who presents for follow up of IBS-C.  The patient was last seen on 09/14/2018. At that time, the patient was advised to start taking MiraLAX to improve her symptoms.  Patient reports that for multiple years (at least 40 years ago) she has presented episodes of constipation which have led to significant discomfort of her abdomen. She states that she has to strain significantly to have a BM but feels it is getting worse recently. She states her symptoms started after she had her children, which all were a vaginal delivery.   The patient states that she is having 1 bowel movement every week but she is still having to strain with each bowel movement. The patient does not have tenesmus but reports that has to strain for several hours. She states her bowel movements have a foul smell, which sometimes have a sticky consistency. She states that she has significant flatulence. She tried using Colace but didn't help her have a bowel movement. She cannot MiraLAX as it caused significant bloating. The patient denies having any nausea, vomiting, fever, chills, hematochezia, melena, hematemesis, abdominal pain, diarrhea, jaundice, pruritus or weight loss.  She states that she has significant discomfort when eating fruits alone, so she has to eat them with other food at the same time. Has tried prunes that soften the bowels.  Last Colonoscopy: 2016, polyp was removed from the hepatic flexure.  Recommend to have repeat colonoscopy in 5  years.  FHx: neg for any gastrointestinal/liver disease, no malignancies Social: smoking in the past but quit at 75 years, neg alcohol or illicit drug use Surgical: c-sectiuon and tubal ligation  Past Medical History: Past Medical History:  Diagnosis Date  . Fibromyalgia   . GERD (gastroesophageal reflux disease)     Past Surgical History: Past Surgical History:  Procedure Laterality Date  . BREAST SURGERY Right    lumpectomy  . CHOLECYSTECTOMY  04/20178   [facility]  . LEFT HEART CATHETERIZATION WITH CORONARY ANGIOGRAM N/A 03/02/2014   Procedure: LEFT HEART CATHETERIZATION WITH CORONARY ANGIOGRAM;  Surgeon: Jettie Booze, MD;  Location: [facility] C[facility] LAB;  Service: Cardiovascular;  Laterality: N/A;  . PERCUTANEOUS CORONARY STENT INTERVENTION (PCI-S)  03/02/2014   Procedure: PERCUTANEOUS CORONARY STENT INTERVENTION (PCI-S);  Surgeon: Jettie Booze, MD;  Location: [facility] CATH LAB;  Service: Cardiovascular;;    Family History: Family History  Problem Relation Age of Onset  . Coronary artery disease Sister 10       MI    Social History: Social History   Tobacco Use  Smoking Status Former Smoker  . Packs/day: 2.00  . Years: 45.00  . Pack years: 90.00  . Types: Cigarettes  . Start date: 10/23/1953  . Quit date: 08/30/1998  . Years since quitting: 22.3  Smokeless Tobacco Never Used   Social History   Substance and Sexual Activity  Alcohol Use Not Currently  . Alcohol/week: 2.0 standard drinks  . Types: 2 Glasses of wine per week   Social History   Substance and Sexual Activity  Drug Use Not Currently    Allergies:  Allergies  Allergen Reactions  . Codeine Nausea Only  . Erythromycin Nausea And Vomiting  . Nsaids Nausea Only  . Sulfa Antibiotics Other (See Comments)    Dry mouth   . Penicillins Itching and Rash    Medications: Current Outpatient Medications  Medication Sig Dispense Refill  . Acetaminophen (TYLENOL ARTHRITIS PAIN PO) Take 2 tablets by mouth  at bedtime.    . ALPRAZolam (XANAX) 1 MG tablet Take 0.5 tablets by mouth at bedtime as needed.    Marland Kitchen amLODipine (NORVASC) 5 MG tablet Take 1 tablet (5 mg total) by mouth daily. (Patient taking differently: Take 5 mg by mouth in the morning and at bedtime.) 90 tablet 3  . aspirin 81 MG tablet Take 81 mg by mouth daily.    . Glycerin-Hypromellose-PEG 400 (DRY EYE RELIEF DROPS OP) Apply to eye as needed.    . metroNIDAZOLE (METROCREAM) 0.75 % cream Apply topically daily. As needed.    . Multiple Vitamins-Minerals (PRESERVISION AREDS PO) Take 1 tablet by mouth daily.    . mupirocin ointment (BACTROBAN) 2 % 1 application daily. As needed.    . nitroGLYCERIN (NITROSTAT) 0.4 MG SL tablet Place 1 tablet (0.4 mg total) under the tongue every 5 (five) minutes x 3 doses as needed for chest pain. 25 tablet 2  . omeprazole (PRILOSEC) 20 MG capsule Take 20 mg by mouth daily.     Marland Kitchen OVER THE COUNTER MEDICATION Flovent HFA two puffs BID    . OVER THE COUNTER MEDICATION Vit D 3 2,000 IU daily    . OVER THE COUNTER MEDICATION Visi Sharp one po BID    . Bilberry 100 MG CAPS Take 1 capsule by mouth daily. (Patient not taking: Reported on 12/30/2020)    . docusate sodium (COLACE) 100 MG capsule Take 100 mg by mouth daily. (Patient not taking: Reported on 12/30/2020)    . losartan (COZAAR) 100 MG tablet Take 1 tablet (100 mg total) by mouth daily. 90 tablet 3  . Multiple Vitamins-Minerals (HAIR SKIN AND NAILS FORMULA PO) Take by mouth.     No current facility-administered medications for this visit.    Review of Systems: GENERAL: negative for malaise, night sweats HEENT: No changes in hearing or vision, no nose bleeds or other nasal problems. NECK: Negative for lumps, goiter, pain and significant neck swelling RESPIRATORY: Negative for cough, wheezing CARDIOVASCULAR: Negative for chest pain, leg swelling, palpitations, orthopnea GI: SEE HPI MUSCULOSKELETAL: Negative for joint pain or swelling, back pain, and  muscle pain. SKIN: Negative for lesions, rash PSYCH: Negative for sleep disturbance, mood disorder and recent psychosocial stressors. HEMATOLOGY Negative for prolonged bleeding, bruising easily, and swollen nodes. ENDOCRINE: Negative for cold or heat intolerance, polyuria, polydipsia and goiter. NEURO: negative for tremor, gait imbalance, syncope and seizures. The remainder of the review of systems is noncontributory.   Physical Exam: BP (!) 175/89 (BP Location: Left Arm, Patient Position: Sitting, Cuff Size: Small)   Pulse 60   Temp 97.9 F (36.6 C) (Oral)   Ht 5\' 2"  (1.575 m)   Wt 143 lb (64.9 kg)   BMI 26.16 kg/m  GENERAL: The patient is AO x3, in no acute distress. Elder  HEENT: Head is normocephalic and atraumatic. EOMI are intact. Mouth is well hydrated and without lesions. NECK: Supple. No masses LUNGS: Clear to auscultation. No presence of rhonchi/wheezing/rales. Adequate chest expansion HEART: RRR, normal s1 and s2. ABDOMEN: Soft, nontender, no guarding, no peritoneal signs, and nondistended. BS +. No masses. EXTREMITIES: Without  any cyanosis, clubbing, rash, lesions or edema. NEUROLOGIC: AOx3, no focal motor deficit. SKIN: no jaundice, no rashes  Imaging/Labs: as above  I personally reviewed and interpreted the available labs, imaging and endoscopic files.  Impression and Plan: Jodi Spencer is a 80 y.o. female with past medical history of IBS-C, fibromyalgia and GERD, who presents for follow up of IBS-C. The patient has presented significant episode of constipation through her life without improvement with over-the-counter laxatives. She has not tolerated MiraLAX adequately given the bloating sensation she presents with this. The patient will be started on Linzess 72 mcg every day to improve her symptoms, she will be given samples of the medication today.  The patient will check with her insurance if this medication is covered and if not she will check the list of  laxatives that are under her plan that could be an option for her chronic disorder.  I also encouraged her to take weekly daily to relieve her symptoms as well.  Patient understood and agreed.  - Start Linzess 72 mcg qday - Take kiwi daily  All questions were answered.      Harvel Quale, MD Gastroenterology and Hepatology [facility]

## 2021-01-01 ENCOUNTER — Ambulatory Visit (INDEPENDENT_AMBULATORY_CARE_PROVIDER_SITE_OTHER): Payer: Medicare HMO | Admitting: Gastroenterology

## 2021-01-13 DIAGNOSIS — I10 Essential (primary) hypertension: Secondary | ICD-10-CM | POA: Diagnosis not present

## 2021-01-13 DIAGNOSIS — E7800 Pure hypercholesterolemia, unspecified: Secondary | ICD-10-CM | POA: Diagnosis not present

## 2021-01-13 DIAGNOSIS — I2510 Atherosclerotic heart disease of native coronary artery without angina pectoris: Secondary | ICD-10-CM | POA: Diagnosis not present

## 2021-02-10 DIAGNOSIS — Z6826 Body mass index (BMI) 26.0-26.9, adult: Secondary | ICD-10-CM | POA: Diagnosis not present

## 2021-02-10 DIAGNOSIS — K219 Gastro-esophageal reflux disease without esophagitis: Secondary | ICD-10-CM | POA: Diagnosis not present

## 2021-02-10 DIAGNOSIS — K5900 Constipation, unspecified: Secondary | ICD-10-CM | POA: Diagnosis not present

## 2021-02-10 DIAGNOSIS — M542 Cervicalgia: Secondary | ICD-10-CM | POA: Diagnosis not present

## 2021-02-10 DIAGNOSIS — I10 Essential (primary) hypertension: Secondary | ICD-10-CM | POA: Diagnosis not present

## 2021-02-10 DIAGNOSIS — E782 Mixed hyperlipidemia: Secondary | ICD-10-CM | POA: Diagnosis not present

## 2021-02-10 DIAGNOSIS — I2510 Atherosclerotic heart disease of native coronary artery without angina pectoris: Secondary | ICD-10-CM | POA: Diagnosis not present

## 2021-02-10 DIAGNOSIS — E7849 Other hyperlipidemia: Secondary | ICD-10-CM | POA: Diagnosis not present

## 2021-02-12 DIAGNOSIS — I10 Essential (primary) hypertension: Secondary | ICD-10-CM | POA: Diagnosis not present

## 2021-02-12 DIAGNOSIS — J441 Chronic obstructive pulmonary disease with (acute) exacerbation: Secondary | ICD-10-CM | POA: Diagnosis not present

## 2021-02-12 DIAGNOSIS — E7849 Other hyperlipidemia: Secondary | ICD-10-CM | POA: Diagnosis not present

## 2021-02-12 DIAGNOSIS — Z87891 Personal history of nicotine dependence: Secondary | ICD-10-CM | POA: Diagnosis not present

## 2021-02-25 DIAGNOSIS — J302 Other seasonal allergic rhinitis: Secondary | ICD-10-CM | POA: Diagnosis not present

## 2021-03-15 DIAGNOSIS — I10 Essential (primary) hypertension: Secondary | ICD-10-CM | POA: Diagnosis not present

## 2021-03-15 DIAGNOSIS — Z87891 Personal history of nicotine dependence: Secondary | ICD-10-CM | POA: Diagnosis not present

## 2021-03-15 DIAGNOSIS — E7849 Other hyperlipidemia: Secondary | ICD-10-CM | POA: Diagnosis not present

## 2021-03-15 DIAGNOSIS — J441 Chronic obstructive pulmonary disease with (acute) exacerbation: Secondary | ICD-10-CM | POA: Diagnosis not present

## 2021-04-09 DIAGNOSIS — L719 Rosacea, unspecified: Secondary | ICD-10-CM | POA: Diagnosis not present

## 2021-04-14 DIAGNOSIS — E7849 Other hyperlipidemia: Secondary | ICD-10-CM | POA: Diagnosis not present

## 2021-04-14 DIAGNOSIS — I10 Essential (primary) hypertension: Secondary | ICD-10-CM | POA: Diagnosis not present

## 2021-04-14 DIAGNOSIS — Z7984 Long term (current) use of oral hypoglycemic drugs: Secondary | ICD-10-CM | POA: Diagnosis not present

## 2021-04-14 DIAGNOSIS — E1165 Type 2 diabetes mellitus with hyperglycemia: Secondary | ICD-10-CM | POA: Diagnosis not present

## 2021-04-25 DIAGNOSIS — H353122 Nonexudative age-related macular degeneration, left eye, intermediate dry stage: Secondary | ICD-10-CM | POA: Diagnosis not present

## 2021-04-25 DIAGNOSIS — H353213 Exudative age-related macular degeneration, right eye, with inactive scar: Secondary | ICD-10-CM | POA: Diagnosis not present

## 2021-05-15 DIAGNOSIS — J441 Chronic obstructive pulmonary disease with (acute) exacerbation: Secondary | ICD-10-CM | POA: Diagnosis not present

## 2021-05-15 DIAGNOSIS — Z87891 Personal history of nicotine dependence: Secondary | ICD-10-CM | POA: Diagnosis not present

## 2021-05-15 DIAGNOSIS — I10 Essential (primary) hypertension: Secondary | ICD-10-CM | POA: Diagnosis not present

## 2021-05-15 DIAGNOSIS — E7849 Other hyperlipidemia: Secondary | ICD-10-CM | POA: Diagnosis not present

## 2021-05-16 DIAGNOSIS — I10 Essential (primary) hypertension: Secondary | ICD-10-CM | POA: Diagnosis not present

## 2021-05-16 DIAGNOSIS — J449 Chronic obstructive pulmonary disease, unspecified: Secondary | ICD-10-CM | POA: Diagnosis not present

## 2021-05-16 DIAGNOSIS — G4700 Insomnia, unspecified: Secondary | ICD-10-CM | POA: Diagnosis not present

## 2021-05-16 DIAGNOSIS — Z7689 Persons encountering health services in other specified circumstances: Secondary | ICD-10-CM | POA: Diagnosis not present

## 2021-05-16 DIAGNOSIS — Z79899 Other long term (current) drug therapy: Secondary | ICD-10-CM | POA: Diagnosis not present

## 2021-05-16 DIAGNOSIS — Z833 Family history of diabetes mellitus: Secondary | ICD-10-CM | POA: Diagnosis not present

## 2021-05-16 DIAGNOSIS — Z87891 Personal history of nicotine dependence: Secondary | ICD-10-CM | POA: Diagnosis not present

## 2021-05-16 DIAGNOSIS — L219 Seborrheic dermatitis, unspecified: Secondary | ICD-10-CM | POA: Diagnosis not present

## 2021-05-16 DIAGNOSIS — K219 Gastro-esophageal reflux disease without esophagitis: Secondary | ICD-10-CM | POA: Diagnosis not present

## 2021-05-16 DIAGNOSIS — Z9049 Acquired absence of other specified parts of digestive tract: Secondary | ICD-10-CM | POA: Diagnosis not present

## 2021-05-22 DIAGNOSIS — K5900 Constipation, unspecified: Secondary | ICD-10-CM | POA: Diagnosis not present

## 2021-05-22 DIAGNOSIS — R69 Illness, unspecified: Secondary | ICD-10-CM | POA: Diagnosis not present

## 2021-05-22 DIAGNOSIS — R0789 Other chest pain: Secondary | ICD-10-CM | POA: Diagnosis not present

## 2021-05-22 DIAGNOSIS — K649 Unspecified hemorrhoids: Secondary | ICD-10-CM | POA: Diagnosis not present

## 2021-06-30 ENCOUNTER — Ambulatory Visit (INDEPENDENT_AMBULATORY_CARE_PROVIDER_SITE_OTHER): Payer: Medicare HMO | Admitting: Gastroenterology

## 2021-07-02 DIAGNOSIS — Z87891 Personal history of nicotine dependence: Secondary | ICD-10-CM | POA: Diagnosis not present

## 2021-07-02 DIAGNOSIS — J449 Chronic obstructive pulmonary disease, unspecified: Secondary | ICD-10-CM | POA: Diagnosis not present

## 2021-07-02 DIAGNOSIS — Z7982 Long term (current) use of aspirin: Secondary | ICD-10-CM | POA: Diagnosis not present

## 2021-07-02 DIAGNOSIS — W109XXA Fall (on) (from) unspecified stairs and steps, initial encounter: Secondary | ICD-10-CM | POA: Diagnosis not present

## 2021-07-02 DIAGNOSIS — Z79899 Other long term (current) drug therapy: Secondary | ICD-10-CM | POA: Diagnosis not present

## 2021-07-02 DIAGNOSIS — S62101A Fracture of unspecified carpal bone, right wrist, initial encounter for closed fracture: Secondary | ICD-10-CM | POA: Diagnosis not present

## 2021-07-02 DIAGNOSIS — I10 Essential (primary) hypertension: Secondary | ICD-10-CM | POA: Diagnosis not present

## 2021-07-02 DIAGNOSIS — S6291XA Unspecified fracture of right wrist and hand, initial encounter for closed fracture: Secondary | ICD-10-CM | POA: Diagnosis not present

## 2021-07-02 DIAGNOSIS — W010XXA Fall on same level from slipping, tripping and stumbling without subsequent striking against object, initial encounter: Secondary | ICD-10-CM | POA: Diagnosis not present

## 2021-07-02 DIAGNOSIS — Z885 Allergy status to narcotic agent status: Secondary | ICD-10-CM | POA: Diagnosis not present

## 2021-07-02 DIAGNOSIS — M79601 Pain in right arm: Secondary | ICD-10-CM | POA: Diagnosis not present

## 2021-07-02 DIAGNOSIS — Z880 Allergy status to penicillin: Secondary | ICD-10-CM | POA: Diagnosis not present

## 2021-07-07 DIAGNOSIS — S52551A Other extraarticular fracture of lower end of right radius, initial encounter for closed fracture: Secondary | ICD-10-CM | POA: Diagnosis not present

## 2021-07-14 DIAGNOSIS — I2510 Atherosclerotic heart disease of native coronary artery without angina pectoris: Secondary | ICD-10-CM | POA: Diagnosis not present

## 2021-07-14 DIAGNOSIS — I10 Essential (primary) hypertension: Secondary | ICD-10-CM | POA: Diagnosis not present

## 2021-07-14 DIAGNOSIS — M25531 Pain in right wrist: Secondary | ICD-10-CM | POA: Diagnosis not present

## 2021-07-14 DIAGNOSIS — M1812 Unilateral primary osteoarthritis of first carpometacarpal joint, left hand: Secondary | ICD-10-CM | POA: Diagnosis not present

## 2021-07-28 DIAGNOSIS — K219 Gastro-esophageal reflux disease without esophagitis: Secondary | ICD-10-CM | POA: Diagnosis not present

## 2021-07-28 DIAGNOSIS — E7849 Other hyperlipidemia: Secondary | ICD-10-CM | POA: Diagnosis not present

## 2021-07-28 DIAGNOSIS — M542 Cervicalgia: Secondary | ICD-10-CM | POA: Diagnosis not present

## 2021-07-28 DIAGNOSIS — K5900 Constipation, unspecified: Secondary | ICD-10-CM | POA: Diagnosis not present

## 2021-07-28 DIAGNOSIS — I10 Essential (primary) hypertension: Secondary | ICD-10-CM | POA: Diagnosis not present

## 2021-07-28 DIAGNOSIS — R0789 Other chest pain: Secondary | ICD-10-CM | POA: Diagnosis not present

## 2021-07-28 DIAGNOSIS — K649 Unspecified hemorrhoids: Secondary | ICD-10-CM | POA: Diagnosis not present

## 2021-07-28 DIAGNOSIS — I2510 Atherosclerotic heart disease of native coronary artery without angina pectoris: Secondary | ICD-10-CM | POA: Diagnosis not present

## 2021-08-04 DIAGNOSIS — I272 Pulmonary hypertension, unspecified: Secondary | ICD-10-CM | POA: Diagnosis not present

## 2021-08-04 DIAGNOSIS — I2510 Atherosclerotic heart disease of native coronary artery without angina pectoris: Secondary | ICD-10-CM | POA: Diagnosis not present

## 2021-08-04 DIAGNOSIS — I10 Essential (primary) hypertension: Secondary | ICD-10-CM | POA: Diagnosis not present

## 2021-08-04 DIAGNOSIS — X58XXXD Exposure to other specified factors, subsequent encounter: Secondary | ICD-10-CM | POA: Diagnosis not present

## 2021-08-04 DIAGNOSIS — Z87891 Personal history of nicotine dependence: Secondary | ICD-10-CM | POA: Diagnosis not present

## 2021-08-04 DIAGNOSIS — S52551D Other extraarticular fracture of lower end of right radius, subsequent encounter for closed fracture with routine healing: Secondary | ICD-10-CM | POA: Diagnosis not present

## 2021-08-04 DIAGNOSIS — I071 Rheumatic tricuspid insufficiency: Secondary | ICD-10-CM | POA: Diagnosis not present

## 2021-08-04 DIAGNOSIS — S52551A Other extraarticular fracture of lower end of right radius, initial encounter for closed fracture: Secondary | ICD-10-CM | POA: Diagnosis not present

## 2021-08-11 DIAGNOSIS — M79644 Pain in right finger(s): Secondary | ICD-10-CM | POA: Diagnosis not present

## 2021-08-18 DIAGNOSIS — S52511D Displaced fracture of right radial styloid process, subsequent encounter for closed fracture with routine healing: Secondary | ICD-10-CM | POA: Diagnosis not present

## 2021-08-18 DIAGNOSIS — M25531 Pain in right wrist: Secondary | ICD-10-CM | POA: Diagnosis not present

## 2021-08-18 DIAGNOSIS — R531 Weakness: Secondary | ICD-10-CM | POA: Diagnosis not present

## 2021-08-18 DIAGNOSIS — M25541 Pain in joints of right hand: Secondary | ICD-10-CM | POA: Diagnosis not present

## 2021-08-18 DIAGNOSIS — M25631 Stiffness of right wrist, not elsewhere classified: Secondary | ICD-10-CM | POA: Diagnosis not present

## 2021-09-01 DIAGNOSIS — S52551D Other extraarticular fracture of lower end of right radius, subsequent encounter for closed fracture with routine healing: Secondary | ICD-10-CM | POA: Diagnosis not present

## 2021-09-01 DIAGNOSIS — M1811 Unilateral primary osteoarthritis of first carpometacarpal joint, right hand: Secondary | ICD-10-CM | POA: Diagnosis not present

## 2021-09-15 DIAGNOSIS — Z23 Encounter for immunization: Secondary | ICD-10-CM | POA: Diagnosis not present

## 2021-10-27 DIAGNOSIS — R350 Frequency of micturition: Secondary | ICD-10-CM | POA: Diagnosis not present

## 2021-10-27 DIAGNOSIS — M791 Myalgia, unspecified site: Secondary | ICD-10-CM | POA: Diagnosis not present

## 2021-10-27 DIAGNOSIS — J069 Acute upper respiratory infection, unspecified: Secondary | ICD-10-CM | POA: Diagnosis not present

## 2021-10-30 DIAGNOSIS — Z955 Presence of coronary angioplasty implant and graft: Secondary | ICD-10-CM | POA: Diagnosis not present

## 2021-10-30 DIAGNOSIS — I10 Essential (primary) hypertension: Secondary | ICD-10-CM | POA: Diagnosis not present

## 2021-10-30 DIAGNOSIS — J441 Chronic obstructive pulmonary disease with (acute) exacerbation: Secondary | ICD-10-CM | POA: Diagnosis not present

## 2021-10-30 DIAGNOSIS — R059 Cough, unspecified: Secondary | ICD-10-CM | POA: Diagnosis not present

## 2021-10-30 DIAGNOSIS — Z20822 Contact with and (suspected) exposure to covid-19: Secondary | ICD-10-CM | POA: Diagnosis not present

## 2021-10-30 DIAGNOSIS — B9689 Other specified bacterial agents as the cause of diseases classified elsewhere: Secondary | ICD-10-CM | POA: Diagnosis not present

## 2021-10-30 DIAGNOSIS — J440 Chronic obstructive pulmonary disease with acute lower respiratory infection: Secondary | ICD-10-CM | POA: Diagnosis not present

## 2021-10-30 DIAGNOSIS — J209 Acute bronchitis, unspecified: Secondary | ICD-10-CM | POA: Diagnosis not present

## 2021-10-30 DIAGNOSIS — I2510 Atherosclerotic heart disease of native coronary artery without angina pectoris: Secondary | ICD-10-CM | POA: Diagnosis not present

## 2021-10-30 DIAGNOSIS — Z87891 Personal history of nicotine dependence: Secondary | ICD-10-CM | POA: Diagnosis not present

## 2021-10-30 DIAGNOSIS — E7800 Pure hypercholesterolemia, unspecified: Secondary | ICD-10-CM | POA: Diagnosis not present

## 2021-11-06 DIAGNOSIS — Z955 Presence of coronary angioplasty implant and graft: Secondary | ICD-10-CM | POA: Diagnosis not present

## 2021-11-06 DIAGNOSIS — J101 Influenza due to other identified influenza virus with other respiratory manifestations: Secondary | ICD-10-CM | POA: Diagnosis not present

## 2021-11-06 DIAGNOSIS — R519 Headache, unspecified: Secondary | ICD-10-CM | POA: Diagnosis not present

## 2021-11-06 DIAGNOSIS — E7800 Pure hypercholesterolemia, unspecified: Secondary | ICD-10-CM | POA: Diagnosis not present

## 2021-11-06 DIAGNOSIS — I2510 Atherosclerotic heart disease of native coronary artery without angina pectoris: Secondary | ICD-10-CM | POA: Diagnosis not present

## 2021-11-06 DIAGNOSIS — I10 Essential (primary) hypertension: Secondary | ICD-10-CM | POA: Diagnosis not present

## 2021-11-06 DIAGNOSIS — I700 Atherosclerosis of aorta: Secondary | ICD-10-CM | POA: Diagnosis not present

## 2021-11-06 DIAGNOSIS — Z20822 Contact with and (suspected) exposure to covid-19: Secondary | ICD-10-CM | POA: Diagnosis not present

## 2021-11-06 DIAGNOSIS — J1089 Influenza due to other identified influenza virus with other manifestations: Secondary | ICD-10-CM | POA: Diagnosis not present

## 2021-11-06 DIAGNOSIS — S0990XA Unspecified injury of head, initial encounter: Secondary | ICD-10-CM | POA: Diagnosis not present

## 2021-11-06 DIAGNOSIS — R42 Dizziness and giddiness: Secondary | ICD-10-CM | POA: Diagnosis not present

## 2021-11-06 DIAGNOSIS — J449 Chronic obstructive pulmonary disease, unspecified: Secondary | ICD-10-CM | POA: Diagnosis not present

## 2021-11-06 DIAGNOSIS — R0602 Shortness of breath: Secondary | ICD-10-CM | POA: Diagnosis not present

## 2021-11-06 DIAGNOSIS — E876 Hypokalemia: Secondary | ICD-10-CM | POA: Diagnosis not present

## 2021-11-12 DIAGNOSIS — J111 Influenza due to unidentified influenza virus with other respiratory manifestations: Secondary | ICD-10-CM | POA: Diagnosis not present

## 2021-11-24 DIAGNOSIS — Z6826 Body mass index (BMI) 26.0-26.9, adult: Secondary | ICD-10-CM | POA: Diagnosis not present

## 2021-11-24 DIAGNOSIS — K649 Unspecified hemorrhoids: Secondary | ICD-10-CM | POA: Diagnosis not present

## 2021-11-24 DIAGNOSIS — K5900 Constipation, unspecified: Secondary | ICD-10-CM | POA: Diagnosis not present

## 2021-11-24 DIAGNOSIS — K219 Gastro-esophageal reflux disease without esophagitis: Secondary | ICD-10-CM | POA: Diagnosis not present

## 2021-11-24 DIAGNOSIS — F419 Anxiety disorder, unspecified: Secondary | ICD-10-CM | POA: Diagnosis not present

## 2021-11-24 DIAGNOSIS — E782 Mixed hyperlipidemia: Secondary | ICD-10-CM | POA: Diagnosis not present

## 2021-11-24 DIAGNOSIS — E7849 Other hyperlipidemia: Secondary | ICD-10-CM | POA: Diagnosis not present

## 2021-11-24 DIAGNOSIS — I10 Essential (primary) hypertension: Secondary | ICD-10-CM | POA: Diagnosis not present

## 2021-11-24 DIAGNOSIS — I2510 Atherosclerotic heart disease of native coronary artery without angina pectoris: Secondary | ICD-10-CM | POA: Diagnosis not present

## 2021-11-24 DIAGNOSIS — M542 Cervicalgia: Secondary | ICD-10-CM | POA: Diagnosis not present

## 2021-11-24 DIAGNOSIS — R69 Illness, unspecified: Secondary | ICD-10-CM | POA: Diagnosis not present

## 2021-12-22 DIAGNOSIS — H353211 Exudative age-related macular degeneration, right eye, with active choroidal neovascularization: Secondary | ICD-10-CM | POA: Diagnosis not present

## 2021-12-29 DIAGNOSIS — I10 Essential (primary) hypertension: Secondary | ICD-10-CM | POA: Diagnosis not present

## 2021-12-29 DIAGNOSIS — I2510 Atherosclerotic heart disease of native coronary artery without angina pectoris: Secondary | ICD-10-CM | POA: Diagnosis not present

## 2021-12-29 DIAGNOSIS — E7800 Pure hypercholesterolemia, unspecified: Secondary | ICD-10-CM | POA: Diagnosis not present

## 2022-01-05 DIAGNOSIS — I10 Essential (primary) hypertension: Secondary | ICD-10-CM | POA: Diagnosis not present

## 2022-01-05 DIAGNOSIS — R69 Illness, unspecified: Secondary | ICD-10-CM | POA: Diagnosis not present

## 2022-01-05 DIAGNOSIS — M65311 Trigger thumb, right thumb: Secondary | ICD-10-CM | POA: Diagnosis not present

## 2022-01-05 DIAGNOSIS — M542 Cervicalgia: Secondary | ICD-10-CM | POA: Diagnosis not present

## 2022-01-05 DIAGNOSIS — K649 Unspecified hemorrhoids: Secondary | ICD-10-CM | POA: Diagnosis not present

## 2022-01-05 DIAGNOSIS — K5900 Constipation, unspecified: Secondary | ICD-10-CM | POA: Diagnosis not present

## 2022-01-05 DIAGNOSIS — M180 Bilateral primary osteoarthritis of first carpometacarpal joints: Secondary | ICD-10-CM | POA: Diagnosis not present

## 2022-01-05 DIAGNOSIS — K219 Gastro-esophageal reflux disease without esophagitis: Secondary | ICD-10-CM | POA: Diagnosis not present

## 2022-01-05 DIAGNOSIS — E7849 Other hyperlipidemia: Secondary | ICD-10-CM | POA: Diagnosis not present

## 2022-01-05 DIAGNOSIS — I2510 Atherosclerotic heart disease of native coronary artery without angina pectoris: Secondary | ICD-10-CM | POA: Diagnosis not present

## 2022-01-09 DIAGNOSIS — Z955 Presence of coronary angioplasty implant and graft: Secondary | ICD-10-CM | POA: Diagnosis not present

## 2022-01-09 DIAGNOSIS — I25119 Atherosclerotic heart disease of native coronary artery with unspecified angina pectoris: Secondary | ICD-10-CM | POA: Diagnosis not present

## 2022-01-09 DIAGNOSIS — I209 Angina pectoris, unspecified: Secondary | ICD-10-CM | POA: Diagnosis not present

## 2022-01-09 DIAGNOSIS — I2510 Atherosclerotic heart disease of native coronary artery without angina pectoris: Secondary | ICD-10-CM | POA: Diagnosis not present

## 2022-03-30 DIAGNOSIS — K649 Unspecified hemorrhoids: Secondary | ICD-10-CM | POA: Diagnosis not present

## 2022-03-30 DIAGNOSIS — E782 Mixed hyperlipidemia: Secondary | ICD-10-CM | POA: Diagnosis not present

## 2022-03-30 DIAGNOSIS — F419 Anxiety disorder, unspecified: Secondary | ICD-10-CM | POA: Diagnosis not present

## 2022-03-30 DIAGNOSIS — K5900 Constipation, unspecified: Secondary | ICD-10-CM | POA: Diagnosis not present

## 2022-03-30 DIAGNOSIS — M542 Cervicalgia: Secondary | ICD-10-CM | POA: Diagnosis not present

## 2022-03-30 DIAGNOSIS — J449 Chronic obstructive pulmonary disease, unspecified: Secondary | ICD-10-CM | POA: Diagnosis not present

## 2022-03-30 DIAGNOSIS — R69 Illness, unspecified: Secondary | ICD-10-CM | POA: Diagnosis not present

## 2022-03-30 DIAGNOSIS — I2510 Atherosclerotic heart disease of native coronary artery without angina pectoris: Secondary | ICD-10-CM | POA: Diagnosis not present

## 2022-03-30 DIAGNOSIS — E7849 Other hyperlipidemia: Secondary | ICD-10-CM | POA: Diagnosis not present

## 2022-03-30 DIAGNOSIS — K219 Gastro-esophageal reflux disease without esophagitis: Secondary | ICD-10-CM | POA: Diagnosis not present

## 2022-03-30 DIAGNOSIS — I10 Essential (primary) hypertension: Secondary | ICD-10-CM | POA: Diagnosis not present

## 2022-03-30 DIAGNOSIS — Z6826 Body mass index (BMI) 26.0-26.9, adult: Secondary | ICD-10-CM | POA: Diagnosis not present

## 2022-04-26 DIAGNOSIS — Z882 Allergy status to sulfonamides status: Secondary | ICD-10-CM | POA: Diagnosis not present

## 2022-04-26 DIAGNOSIS — R112 Nausea with vomiting, unspecified: Secondary | ICD-10-CM | POA: Diagnosis not present

## 2022-04-26 DIAGNOSIS — Z79899 Other long term (current) drug therapy: Secondary | ICD-10-CM | POA: Diagnosis not present

## 2022-04-26 DIAGNOSIS — R001 Bradycardia, unspecified: Secondary | ICD-10-CM | POA: Diagnosis not present

## 2022-04-26 DIAGNOSIS — Z880 Allergy status to penicillin: Secondary | ICD-10-CM | POA: Diagnosis not present

## 2022-04-26 DIAGNOSIS — R42 Dizziness and giddiness: Secondary | ICD-10-CM | POA: Diagnosis not present

## 2022-04-26 DIAGNOSIS — R059 Cough, unspecified: Secondary | ICD-10-CM | POA: Diagnosis not present

## 2022-04-26 DIAGNOSIS — Z886 Allergy status to analgesic agent status: Secondary | ICD-10-CM | POA: Diagnosis not present

## 2022-04-26 DIAGNOSIS — U071 COVID-19: Secondary | ICD-10-CM | POA: Diagnosis not present

## 2022-04-26 DIAGNOSIS — E7800 Pure hypercholesterolemia, unspecified: Secondary | ICD-10-CM | POA: Diagnosis not present

## 2022-04-26 DIAGNOSIS — I2510 Atherosclerotic heart disease of native coronary artery without angina pectoris: Secondary | ICD-10-CM | POA: Diagnosis not present

## 2022-04-26 DIAGNOSIS — Z885 Allergy status to narcotic agent status: Secondary | ICD-10-CM | POA: Diagnosis not present

## 2022-04-26 DIAGNOSIS — R197 Diarrhea, unspecified: Secondary | ICD-10-CM | POA: Diagnosis not present

## 2022-04-26 DIAGNOSIS — R0981 Nasal congestion: Secondary | ICD-10-CM | POA: Diagnosis not present

## 2022-04-26 DIAGNOSIS — J449 Chronic obstructive pulmonary disease, unspecified: Secondary | ICD-10-CM | POA: Diagnosis not present

## 2022-04-26 DIAGNOSIS — E860 Dehydration: Secondary | ICD-10-CM | POA: Diagnosis not present

## 2022-04-26 DIAGNOSIS — Z7982 Long term (current) use of aspirin: Secondary | ICD-10-CM | POA: Diagnosis not present

## 2022-04-26 DIAGNOSIS — Z87891 Personal history of nicotine dependence: Secondary | ICD-10-CM | POA: Diagnosis not present

## 2022-04-27 DIAGNOSIS — Z20828 Contact with and (suspected) exposure to other viral communicable diseases: Secondary | ICD-10-CM | POA: Diagnosis not present

## 2022-05-05 DIAGNOSIS — Z20828 Contact with and (suspected) exposure to other viral communicable diseases: Secondary | ICD-10-CM | POA: Diagnosis not present

## 2022-05-05 DIAGNOSIS — R030 Elevated blood-pressure reading, without diagnosis of hypertension: Secondary | ICD-10-CM | POA: Diagnosis not present

## 2022-05-05 DIAGNOSIS — Z6825 Body mass index (BMI) 25.0-25.9, adult: Secondary | ICD-10-CM | POA: Diagnosis not present

## 2022-05-27 DIAGNOSIS — D239 Other benign neoplasm of skin, unspecified: Secondary | ICD-10-CM | POA: Diagnosis not present

## 2022-05-27 DIAGNOSIS — Z1283 Encounter for screening for malignant neoplasm of skin: Secondary | ICD-10-CM | POA: Diagnosis not present

## 2022-05-27 DIAGNOSIS — L821 Other seborrheic keratosis: Secondary | ICD-10-CM | POA: Diagnosis not present

## 2022-06-03 DIAGNOSIS — I2510 Atherosclerotic heart disease of native coronary artery without angina pectoris: Secondary | ICD-10-CM | POA: Diagnosis not present

## 2022-06-03 DIAGNOSIS — M542 Cervicalgia: Secondary | ICD-10-CM | POA: Diagnosis not present

## 2022-06-03 DIAGNOSIS — K5900 Constipation, unspecified: Secondary | ICD-10-CM | POA: Diagnosis not present

## 2022-06-03 DIAGNOSIS — J019 Acute sinusitis, unspecified: Secondary | ICD-10-CM | POA: Diagnosis not present

## 2022-06-03 DIAGNOSIS — R69 Illness, unspecified: Secondary | ICD-10-CM | POA: Diagnosis not present

## 2022-06-03 DIAGNOSIS — J449 Chronic obstructive pulmonary disease, unspecified: Secondary | ICD-10-CM | POA: Diagnosis not present

## 2022-06-03 DIAGNOSIS — I10 Essential (primary) hypertension: Secondary | ICD-10-CM | POA: Diagnosis not present

## 2022-06-03 DIAGNOSIS — K649 Unspecified hemorrhoids: Secondary | ICD-10-CM | POA: Diagnosis not present

## 2022-06-03 DIAGNOSIS — K219 Gastro-esophageal reflux disease without esophagitis: Secondary | ICD-10-CM | POA: Diagnosis not present

## 2022-06-03 DIAGNOSIS — E7849 Other hyperlipidemia: Secondary | ICD-10-CM | POA: Diagnosis not present

## 2022-06-03 DIAGNOSIS — Z6826 Body mass index (BMI) 26.0-26.9, adult: Secondary | ICD-10-CM | POA: Diagnosis not present

## 2022-06-16 DIAGNOSIS — M180 Bilateral primary osteoarthritis of first carpometacarpal joints: Secondary | ICD-10-CM | POA: Diagnosis not present

## 2022-06-16 DIAGNOSIS — M65311 Trigger thumb, right thumb: Secondary | ICD-10-CM | POA: Diagnosis not present

## 2022-06-16 DIAGNOSIS — J302 Other seasonal allergic rhinitis: Secondary | ICD-10-CM | POA: Diagnosis not present

## 2022-06-16 DIAGNOSIS — R059 Cough, unspecified: Secondary | ICD-10-CM | POA: Diagnosis not present

## 2022-06-29 DIAGNOSIS — H40013 Open angle with borderline findings, low risk, bilateral: Secondary | ICD-10-CM | POA: Diagnosis not present

## 2022-07-27 DIAGNOSIS — R030 Elevated blood-pressure reading, without diagnosis of hypertension: Secondary | ICD-10-CM | POA: Diagnosis not present

## 2022-07-27 DIAGNOSIS — J309 Allergic rhinitis, unspecified: Secondary | ICD-10-CM | POA: Diagnosis not present

## 2022-07-27 DIAGNOSIS — J209 Acute bronchitis, unspecified: Secondary | ICD-10-CM | POA: Diagnosis not present

## 2022-08-12 DIAGNOSIS — R059 Cough, unspecified: Secondary | ICD-10-CM | POA: Diagnosis not present

## 2022-08-12 DIAGNOSIS — R030 Elevated blood-pressure reading, without diagnosis of hypertension: Secondary | ICD-10-CM | POA: Diagnosis not present

## 2022-08-12 DIAGNOSIS — Z6824 Body mass index (BMI) 24.0-24.9, adult: Secondary | ICD-10-CM | POA: Diagnosis not present

## 2022-08-19 ENCOUNTER — Encounter: Payer: Self-pay | Admitting: Allergy & Immunology

## 2022-08-19 ENCOUNTER — Ambulatory Visit: Payer: Medicare HMO | Admitting: Allergy & Immunology

## 2022-08-19 VITALS — BP 132/70 | HR 76 | Temp 98.3°F | Resp 20 | Ht 60.0 in | Wt 136.1 lb

## 2022-08-19 DIAGNOSIS — J3089 Other allergic rhinitis: Secondary | ICD-10-CM

## 2022-08-19 DIAGNOSIS — J302 Other seasonal allergic rhinitis: Secondary | ICD-10-CM

## 2022-08-19 DIAGNOSIS — J310 Chronic rhinitis: Secondary | ICD-10-CM

## 2022-08-19 DIAGNOSIS — J449 Chronic obstructive pulmonary disease, unspecified: Secondary | ICD-10-CM

## 2022-08-19 DIAGNOSIS — J329 Chronic sinusitis, unspecified: Secondary | ICD-10-CM

## 2022-08-19 NOTE — Patient Instructions (Addendum)
1. Chronic obstructive pulmonary disease, unspecified COPD type ([place]) - Lung testing looked good despite your coughing. - Continue with your Flovent two puffs twice daily. - Continue with albuterol 4 puffs every 4-6 hours as needed.   2. Chronic rhinitis - Testing today showed: indoor molds and outdoor molds. - Copy of test results provided.  - Avoidance measures provided. - Start taking: Atrovent (ipratropium) 0.03% one spray per nostril 2-3 times daily as needed (CAN BE OVER DRYING) - You can use an extra dose of the antihistamine, if needed, for breakthrough symptoms.  - Consider nasal saline rinses 1-2 times daily to remove allergens from the nasal cavities as well as help with mucous clearance (this is especially helpful to do before the nasal sprays are given) - We are going to get a sinus CT to see whether there are any anatomical issues that are leading to your symptoms.  - We might have to jump through some hoops to get everything approved.   3. Return in about 3 months (around 11/19/2022).    Please inform us of any Emergency Department visits, hospitalizations, or changes in symptoms. Call us before going to the ED for breathing or allergy symptoms since we might be able to fit you in for a sick visit. Feel free to contact us anytime with any questions, problems, or concerns.  It was a pleasure to meet you today!  Websites that have reliable patient information: 1. American Academy of Asthma, Allergy, and Immunology: www.aaaai.org 2. Food Allergy Research and Education (FARE): foodallergy.org 3. Mothers of Asthmatics: http://www.asthmacommunitynetwork.org 4. American College of Allergy, Asthma, and Immunology: www.acaai.org   COVID-19 Vaccine Information can be found at: [email or web address].[email or web address] For questions related to vaccine distribution or appointments, please email [email or web address] or call [phone].   We  realize that you might be concerned about having an allergic reaction to the COVID19 vaccines. To help with that concern, WE ARE OFFERING THE COVID19 VACCINES IN OUR OFFICE! Ask the front desk for dates!     "Like" [place] on Facebook and Instagram for our latest updates!      A healthy democracy works best when [employer] participate! Make sure you are registered to vote! If you have moved or changed any of your contact information, you will need to get this updated before voting!  In some cases, you MAY be able to register to vote online: [email or web address]       Intradermal - 08/19/22 1545     Time Antigen Placed 1545    Allergen Manufacturer Lavella Hammock    Location Arm    Number of Test 15    Control Negative    [place] Negative    Johnson Negative    7 Grass Negative    Ragweed mix Negative    Weed mix Negative    Tree mix Negative    Mold 1 2+    Mold 2 2+    Mold 3 Negative    Mold 4 2+    Cat Negative    Dog Negative    Cockroach Negative    Mite mix Negative            Control of Mold Allergen   Mold and fungi can grow on a variety of surfaces provided certain temperature and moisture conditions exist.  Outdoor molds grow on plants, decaying vegetation and soil.  The major outdoor mold, Alternaria and Cladosporium, are found in very high numbers during hot and dry conditions.  Generally, a late Summer - Fall  peak is seen for common outdoor fungal spores.  Rain will temporarily lower outdoor mold spore count, but counts rise rapidly when the rainy period ends.  The most important indoor molds are Aspergillus and Penicillium.  Dark, humid and poorly ventilated basements are ideal sites for mold growth.  The next most common sites of mold growth are the bathroom and the kitchen.  Outdoor (Seasonal) Mold Control  Positive outdoor molds via skin testing: Alternaria and Cladosporium  Use air conditioning and keep windows closed Avoid exposure  to decaying vegetation. Avoid leaf raking. Avoid grain handling. Consider wearing a face mask if working in moldy areas.    Indoor (Perennial) Mold Control   Positive indoor molds via skin testing: Aspergillus, Penicillium, Fusarium, Aureobasidium ([place]ulara), and Rhizopus  Maintain humidity below 50%. Clean washable surfaces with 5% bleach solution. Remove sources e.g. contaminated carpets.

## 2022-08-19 NOTE — Progress Notes (Signed)
NEW PATIENT  Date of Service/Encounter:  08/19/22  Consult requested by: Rosalee Kaufman, PA-C   Assessment:   No diagnosis found.  Plan/Recommendations:    There are no Patient Instructions on file for this visit.   {Blank single:19197::"This note in its entirety was forwarded to the Provider who requested this consultation."}  Subjective:   LATERRA LUBINSKI is a 81 y.o. female presenting today for evaluation of  Chief Complaint  Patient presents with  . Cough    Gets a cough so bad and gets dizzy and gets sick to her stomach. Has hard time sleeping with the cough. Has been having a lot of phlegm. She pulls it out of her mouth and her nose.  -Her PCP took her off the lisinopril since it was possibly causing her dry cough but she wants to an explanation to the phlegm   . Allergic Rhinitis     Congestion, runny nose     MARCA GADSBY has a history of the following: Patient Active Problem List   Diagnosis Date Noted  . IBS (irritable bowel syndrome) 12/30/2020  . CAD- RCA DES 03/02/14 03/03/2014  . NSTEMI (non-ST elevated myocardial infarction) ([place]) 02/28/2014  . HTN (hypertension) 02/28/2014  . HLD (hyperlipidemia) 02/28/2014    History obtained from: chart review and {Persons; PED relatives w/patient:19415::"patient"}.  Hebert Soho was referred by Rosalee Kaufman, PA-C.     Beckie is a 81 y.o. female presenting for {Blank single:19197::"a food challenge","a drug challenge","skin testing","a sick visit","an evaluation of ***","a follow up visit"}.  She started coughing a long time. She got COVID in June and this is when it got particularly bad. She reports that she gets sick to her stomach when she coughs so much. Nighttime is worse. She coughs until 4 am in the morning before getting some sleep. She has no congestion deeper at all.   Asthma/Respiratory Symptom History: She has been on Flovent two puffs twice daily. She has been on that for  years. She has been given prednisone for this and it did not help at all. She is unsure what is going on.  She never had a sputum culture. She only ever did the CXR from the outside hospital.   She has an ENT referral in place. This is the first stop, however. She did get referred to a specialist in [place] and was told to "take allergy pills". This was an "allergy specialist", but she is unsure of the name of this person.  She has been tested in the past. She knows that she is allergic to dust as well as cats. She has a cat who is fine as long as she stay inside of the home. She was never on allergy shots. She thinks that this was when she was in her 72s.   She has tried nose sprays, but it never seems to be able to get into her nose at all. She has some rhinorrhea intermittent on the left side.  {Blank single:19197::"Allergic Rhinitis Symptom History: ***"," "}  {Blank single:19197::"Food Allergy Symptom History: ***"," "}  {Blank single:19197::"Skin Symptom History: ***"," "}  {Blank single:19197::"GERD Symptom History: ***"," "}  ***Otherwise, there is no history of other atopic diseases, including {Blank multiple:19196:o:"asthma","food allergies","drug allergies","environmental allergies","stinging insect allergies","eczema","urticaria","contact dermatitis"}. There is no significant infectious history. ***Vaccinations are up to date.    Past Medical History: Patient Active Problem List   Diagnosis Date Noted  . IBS (irritable bowel syndrome) 12/30/2020  . CAD- RCA DES  03/02/14 03/03/2014  . NSTEMI (non-ST elevated myocardial infarction) ([place]) 02/28/2014  . HTN (hypertension) 02/28/2014  . HLD (hyperlipidemia) 02/28/2014    Medication List:  Allergies as of 08/19/2022       Reactions   Codeine Nausea Only   Erythromycin Nausea And Vomiting   Nsaids Nausea Only   Sulfa Antibiotics Other (See Comments)   Dry mouth   Penicillins Itching, Rash        Medication List         Accurate as of August 19, 2022  2:45 PM. If you have any questions, ask your nurse or doctor.          albuterol (2.5 MG/3ML) 0.083% nebulizer solution Commonly known as: PROVENTIL Inhale into the lungs.   ALPRAZolam 1 MG tablet Commonly known as: XANAX Take 0.5 tablets by mouth at bedtime as needed.   amLODipine 5 MG tablet Commonly known as: NORVASC Take 1 tablet (5 mg total) by mouth daily. What changed: when to take this   aspirin 81 MG tablet Take 81 mg by mouth daily.   Bilberry 100 MG Caps Take 1 capsule by mouth daily.   docusate sodium 100 MG capsule Commonly known as: COLACE Take 100 mg by mouth daily.   DRY EYE RELIEF DROPS OP Apply to eye as needed.   Flovent HFA 44 MCG/ACT inhaler Generic drug: fluticasone Inhale 2 puffs into the lungs 2 (two) times daily.   ipratropium 0.06 % nasal spray Commonly known as: ATROVENT Place 2 sprays into both nostrils 4 (four) times daily.   linaclotide 72 MCG capsule Commonly known as: Linzess Take 1 capsule (72 mcg total) by mouth daily before breakfast.   losartan 100 MG tablet Commonly known as: COZAAR Take 1 tablet (100 mg total) by mouth daily.   metroNIDAZOLE 0.75 % cream Commonly known as: METROCREAM Apply topically daily. As needed.   mupirocin ointment 2 % Commonly known as: BACTROBAN 1 application daily. As needed.   nitroGLYCERIN 0.4 MG SL tablet Commonly known as: NITROSTAT Place 1 tablet (0.4 mg total) under the tongue every 5 (five) minutes x 3 doses as needed for chest pain.   omeprazole 20 MG capsule Commonly known as: PRILOSEC Take 20 mg by mouth daily.   OVER THE COUNTER MEDICATION Flovent HFA two puffs BID   OVER THE COUNTER MEDICATION Vit D 3 2,000 IU daily   OVER THE COUNTER MEDICATION Visi Sharp one po BID   PRESERVISION AREDS PO Take 1 tablet by mouth daily.   HAIR SKIN AND NAILS FORMULA PO Take by mouth.   TYLENOL ARTHRITIS PAIN PO Take 2 tablets by mouth at  bedtime.        Birth History: {Blank single:19197::"non-contributory","born premature and spent time in the NICU","born at term without complications"}  Developmental History: Balbina has met all milestones on time. She has required no {Blank multiple:19196:a:"speech therapy","occupational therapy","physical therapy"}. ***non-contributory  Past Surgical History: Past Surgical History:  Procedure Laterality Date  . BREAST SURGERY Right    lumpectomy  . CHOLECYSTECTOMY  04/20178   [facility]  . LEFT HEART CATHETERIZATION WITH CORONARY ANGIOGRAM N/A 03/02/2014   Procedure: LEFT HEART CATHETERIZATION WITH CORONARY ANGIOGRAM;  Surgeon: Jettie Booze, MD;  Location: [facility] CATH LAB;  Service: Cardiovascular;  Laterality: N/A;  . PERCUTANEOUS CORONARY STENT INTERVENTION (PCI-S)  03/02/2014   Procedure: PERCUTANEOUS CORONARY STENT INTERVENTION (PCI-S);  Surgeon: Jettie Booze, MD;  Location: [facility] CATH LAB;  Service: Cardiovascular;;     Family History: Family History  Problem Relation Age of Onset  . Coronary artery disease Sister 14       MI     Social History: Yasmen lives at home with ***.  She was in a house that is around 83 years old.  There is a cat inside of the home.  She is on screening and cooling.  There is no tobacco exposure.  There is no HEPA filter in the home.  She does not live near an interstate or industrial area.   ROS     Objective:   Blood pressure 132/70, pulse 76, temperature 98.3 F (36.8 C), resp. rate 20, height 5' (1.524 m), weight 136 lb 2 oz (61.7 kg), SpO2 97 %. Body mass index is 26.59 kg/m.     Physical Exam   Diagnostic studies:    Spirometry: results normal (FEV1: 1.20/74%, FVC: 1.70/80%, FEV1/FVC: 71%).    Spirometry consistent with normal pattern.   Allergy Studies: {Blank single:19197::"none","labs sent instead"," "}    {Blank single:19197::"Allergy testing results were read and interpreted by myself, documented by clinical  staff."," "}         Salvatore Marvel, MD Allergy and [place] [facility]

## 2022-08-20 ENCOUNTER — Encounter: Payer: Self-pay | Admitting: Allergy & Immunology

## 2022-08-20 DIAGNOSIS — J302 Other seasonal allergic rhinitis: Secondary | ICD-10-CM | POA: Insufficient documentation

## 2022-08-20 DIAGNOSIS — J449 Chronic obstructive pulmonary disease, unspecified: Secondary | ICD-10-CM | POA: Insufficient documentation

## 2022-08-24 DIAGNOSIS — K649 Unspecified hemorrhoids: Secondary | ICD-10-CM | POA: Diagnosis not present

## 2022-08-24 DIAGNOSIS — E7849 Other hyperlipidemia: Secondary | ICD-10-CM | POA: Diagnosis not present

## 2022-08-24 DIAGNOSIS — R059 Cough, unspecified: Secondary | ICD-10-CM | POA: Diagnosis not present

## 2022-08-24 DIAGNOSIS — J449 Chronic obstructive pulmonary disease, unspecified: Secondary | ICD-10-CM | POA: Diagnosis not present

## 2022-08-24 DIAGNOSIS — R69 Illness, unspecified: Secondary | ICD-10-CM | POA: Diagnosis not present

## 2022-08-24 DIAGNOSIS — Z6825 Body mass index (BMI) 25.0-25.9, adult: Secondary | ICD-10-CM | POA: Diagnosis not present

## 2022-08-24 DIAGNOSIS — M542 Cervicalgia: Secondary | ICD-10-CM | POA: Diagnosis not present

## 2022-08-24 DIAGNOSIS — K5900 Constipation, unspecified: Secondary | ICD-10-CM | POA: Diagnosis not present

## 2022-08-24 DIAGNOSIS — K219 Gastro-esophageal reflux disease without esophagitis: Secondary | ICD-10-CM | POA: Diagnosis not present

## 2022-08-24 DIAGNOSIS — I2510 Atherosclerotic heart disease of native coronary artery without angina pectoris: Secondary | ICD-10-CM | POA: Diagnosis not present

## 2022-08-24 DIAGNOSIS — I10 Essential (primary) hypertension: Secondary | ICD-10-CM | POA: Diagnosis not present

## 2022-08-25 DIAGNOSIS — J310 Chronic rhinitis: Secondary | ICD-10-CM | POA: Diagnosis not present

## 2022-08-26 ENCOUNTER — Telehealth: Payer: Self-pay | Admitting: *Deleted

## 2022-08-26 NOTE — Telephone Encounter (Signed)
-----   Message from Valentina Shaggy, MD sent at 08/20/2022  8:10 AM EDT ----- Sinus CT ordered. s/p two courses of antibiotics and one course of prednisone since July 2023

## 2022-08-26 NOTE — Telephone Encounter (Signed)
Will work on PA for Sinus CT tomorrow afternoon.

## 2022-08-28 NOTE — Telephone Encounter (Signed)
I called to initiate a PA for the Sinus CT without contrast and they stated that there already was an approval from [employer] [id number] Case Number [id number]. They stated that it was approved on 08/24/2022. They said that they would have to call and cancel their prior authorization in order for [place] to complete ours, our case number [id number]. Does this sound familiar about that provider ordering a CT Scan?

## 2022-09-01 NOTE — Telephone Encounter (Signed)
I will call the PCP tomorrow to confirm and then will call the patient to confirm and will get her scheduled.

## 2022-09-01 NOTE — Telephone Encounter (Signed)
If she is already approved, let's just go with it. Maybe the patient did not realize that her PCP was ordering one? Thanks much!   Salvatore Marvel, MD Allergy and [place] [facility] [place]

## 2022-09-07 NOTE — Telephone Encounter (Signed)
PA is still pending.  

## 2022-09-07 NOTE — Telephone Encounter (Signed)
Error, please disregards last message. Currently in the process of trying to get through the PCP office in regards to CT scan.

## 2022-09-09 DIAGNOSIS — E559 Vitamin D deficiency, unspecified: Secondary | ICD-10-CM | POA: Diagnosis not present

## 2022-09-09 DIAGNOSIS — K649 Unspecified hemorrhoids: Secondary | ICD-10-CM | POA: Diagnosis not present

## 2022-09-09 DIAGNOSIS — Z23 Encounter for immunization: Secondary | ICD-10-CM | POA: Diagnosis not present

## 2022-09-09 DIAGNOSIS — K5900 Constipation, unspecified: Secondary | ICD-10-CM | POA: Diagnosis not present

## 2022-09-09 DIAGNOSIS — Z0000 Encounter for general adult medical examination without abnormal findings: Secondary | ICD-10-CM | POA: Diagnosis not present

## 2022-09-09 DIAGNOSIS — R69 Illness, unspecified: Secondary | ICD-10-CM | POA: Diagnosis not present

## 2022-09-09 DIAGNOSIS — M542 Cervicalgia: Secondary | ICD-10-CM | POA: Diagnosis not present

## 2022-09-09 DIAGNOSIS — J449 Chronic obstructive pulmonary disease, unspecified: Secondary | ICD-10-CM | POA: Diagnosis not present

## 2022-09-09 DIAGNOSIS — K219 Gastro-esophageal reflux disease without esophagitis: Secondary | ICD-10-CM | POA: Diagnosis not present

## 2022-09-09 DIAGNOSIS — E782 Mixed hyperlipidemia: Secondary | ICD-10-CM | POA: Diagnosis not present

## 2022-09-09 DIAGNOSIS — E7849 Other hyperlipidemia: Secondary | ICD-10-CM | POA: Diagnosis not present

## 2022-09-09 DIAGNOSIS — I10 Essential (primary) hypertension: Secondary | ICD-10-CM | POA: Diagnosis not present

## 2022-09-09 DIAGNOSIS — J309 Allergic rhinitis, unspecified: Secondary | ICD-10-CM | POA: Diagnosis not present

## 2022-09-09 DIAGNOSIS — Z6826 Body mass index (BMI) 26.0-26.9, adult: Secondary | ICD-10-CM | POA: Diagnosis not present

## 2022-09-09 DIAGNOSIS — I2510 Atherosclerotic heart disease of native coronary artery without angina pectoris: Secondary | ICD-10-CM | POA: Diagnosis not present

## 2022-09-11 NOTE — Telephone Encounter (Signed)
Called the patient's PCP office and left a voicemail with Fuller [place] with the referral department, left a voicemail about details of the CT scan and approved PA from their office and asked for her to return my call to obtain more information so that we can get the patient scheduled for her CT.

## 2022-09-18 NOTE — Telephone Encounter (Signed)
Called and spoke with [facility] with the Referral department and she stated that the patient had a sinus CT scan on October 10th and that she is faxing over the results to our [place] office now. I will be sure to forward the results to Dr. Ernst Bowler.

## 2022-10-06 NOTE — Telephone Encounter (Signed)
Can someone call the patient to let her know that her sinus CT was largely normal? She had some mucous in one of her less important sinuses - the ethmoid - but otherwise every looked completely clear. How is she feeling?   Salvatore Marvel, MD Allergy and [place] [facility] [place]

## 2022-10-06 NOTE — Telephone Encounter (Signed)
Attempted to call mobile phone, there was no answer and was unable to leave voicemail. Attempted to call house phone and there was no answer and was unable to leave voicemail. Will need to attempt to call again.

## 2022-10-07 NOTE — Telephone Encounter (Signed)
Called and spoke with patient and advised of CT results. Patient verbalized understanding and stated that she is feeling much better.

## 2022-10-12 NOTE — Telephone Encounter (Signed)
Thanks for the update!   Fara Olden

## 2022-10-26 DIAGNOSIS — K5900 Constipation, unspecified: Secondary | ICD-10-CM | POA: Diagnosis not present

## 2022-10-26 DIAGNOSIS — K649 Unspecified hemorrhoids: Secondary | ICD-10-CM | POA: Diagnosis not present

## 2022-10-26 DIAGNOSIS — I2510 Atherosclerotic heart disease of native coronary artery without angina pectoris: Secondary | ICD-10-CM | POA: Diagnosis not present

## 2022-10-26 DIAGNOSIS — R609 Edema, unspecified: Secondary | ICD-10-CM | POA: Diagnosis not present

## 2022-10-26 DIAGNOSIS — I10 Essential (primary) hypertension: Secondary | ICD-10-CM | POA: Diagnosis not present

## 2022-10-26 DIAGNOSIS — M542 Cervicalgia: Secondary | ICD-10-CM | POA: Diagnosis not present

## 2022-10-26 DIAGNOSIS — J449 Chronic obstructive pulmonary disease, unspecified: Secondary | ICD-10-CM | POA: Diagnosis not present

## 2022-10-26 DIAGNOSIS — R69 Illness, unspecified: Secondary | ICD-10-CM | POA: Diagnosis not present

## 2022-10-26 DIAGNOSIS — E7849 Other hyperlipidemia: Secondary | ICD-10-CM | POA: Diagnosis not present

## 2022-10-26 DIAGNOSIS — K219 Gastro-esophageal reflux disease without esophagitis: Secondary | ICD-10-CM | POA: Diagnosis not present

## 2022-10-26 DIAGNOSIS — J309 Allergic rhinitis, unspecified: Secondary | ICD-10-CM | POA: Diagnosis not present

## 2022-10-26 DIAGNOSIS — L0390 Cellulitis, unspecified: Secondary | ICD-10-CM | POA: Diagnosis not present

## 2022-10-29 ENCOUNTER — Encounter (INDEPENDENT_AMBULATORY_CARE_PROVIDER_SITE_OTHER): Payer: Self-pay | Admitting: *Deleted

## 2022-11-02 DIAGNOSIS — I10 Essential (primary) hypertension: Secondary | ICD-10-CM | POA: Diagnosis not present

## 2022-11-02 DIAGNOSIS — I2510 Atherosclerotic heart disease of native coronary artery without angina pectoris: Secondary | ICD-10-CM | POA: Diagnosis not present

## 2022-11-02 DIAGNOSIS — E782 Mixed hyperlipidemia: Secondary | ICD-10-CM | POA: Diagnosis not present

## 2022-11-23 ENCOUNTER — Ambulatory Visit: Payer: Medicare HMO | Admitting: Internal Medicine

## 2022-11-26 DIAGNOSIS — M810 Age-related osteoporosis without current pathological fracture: Secondary | ICD-10-CM | POA: Diagnosis not present

## 2022-12-02 DIAGNOSIS — Z6823 Body mass index (BMI) 23.0-23.9, adult: Secondary | ICD-10-CM | POA: Diagnosis not present

## 2022-12-02 DIAGNOSIS — R030 Elevated blood-pressure reading, without diagnosis of hypertension: Secondary | ICD-10-CM | POA: Diagnosis not present

## 2022-12-02 DIAGNOSIS — L0390 Cellulitis, unspecified: Secondary | ICD-10-CM | POA: Diagnosis not present

## 2022-12-24 DIAGNOSIS — M180 Bilateral primary osteoarthritis of first carpometacarpal joints: Secondary | ICD-10-CM | POA: Diagnosis not present

## 2022-12-24 DIAGNOSIS — M65311 Trigger thumb, right thumb: Secondary | ICD-10-CM | POA: Diagnosis not present

## 2022-12-28 ENCOUNTER — Ambulatory Visit (INDEPENDENT_AMBULATORY_CARE_PROVIDER_SITE_OTHER): Payer: Medicare HMO | Admitting: Gastroenterology

## 2023-01-04 DIAGNOSIS — M1812 Unilateral primary osteoarthritis of first carpometacarpal joint, left hand: Secondary | ICD-10-CM | POA: Diagnosis not present

## 2023-01-04 DIAGNOSIS — M65311 Trigger thumb, right thumb: Secondary | ICD-10-CM | POA: Diagnosis not present

## 2023-01-04 DIAGNOSIS — H353124 Nonexudative age-related macular degeneration, left eye, advanced atrophic with subfoveal involvement: Secondary | ICD-10-CM | POA: Diagnosis not present

## 2023-01-25 DIAGNOSIS — J309 Allergic rhinitis, unspecified: Secondary | ICD-10-CM | POA: Diagnosis not present

## 2023-01-25 DIAGNOSIS — K649 Unspecified hemorrhoids: Secondary | ICD-10-CM | POA: Diagnosis not present

## 2023-01-25 DIAGNOSIS — Z6823 Body mass index (BMI) 23.0-23.9, adult: Secondary | ICD-10-CM | POA: Diagnosis not present

## 2023-01-25 DIAGNOSIS — M542 Cervicalgia: Secondary | ICD-10-CM | POA: Diagnosis not present

## 2023-01-25 DIAGNOSIS — K5900 Constipation, unspecified: Secondary | ICD-10-CM | POA: Diagnosis not present

## 2023-01-25 DIAGNOSIS — J449 Chronic obstructive pulmonary disease, unspecified: Secondary | ICD-10-CM | POA: Diagnosis not present

## 2023-01-25 DIAGNOSIS — I2510 Atherosclerotic heart disease of native coronary artery without angina pectoris: Secondary | ICD-10-CM | POA: Diagnosis not present

## 2023-01-25 DIAGNOSIS — E7849 Other hyperlipidemia: Secondary | ICD-10-CM | POA: Diagnosis not present

## 2023-01-25 DIAGNOSIS — R69 Illness, unspecified: Secondary | ICD-10-CM | POA: Diagnosis not present

## 2023-01-25 DIAGNOSIS — I10 Essential (primary) hypertension: Secondary | ICD-10-CM | POA: Diagnosis not present

## 2023-01-25 DIAGNOSIS — K219 Gastro-esophageal reflux disease without esophagitis: Secondary | ICD-10-CM | POA: Diagnosis not present

## 2023-02-01 DIAGNOSIS — H01002 Unspecified blepharitis right lower eyelid: Secondary | ICD-10-CM | POA: Diagnosis not present

## 2023-02-01 DIAGNOSIS — H26493 Other secondary cataract, bilateral: Secondary | ICD-10-CM | POA: Diagnosis not present

## 2023-02-01 DIAGNOSIS — H353134 Nonexudative age-related macular degeneration, bilateral, advanced atrophic with subfoveal involvement: Secondary | ICD-10-CM | POA: Diagnosis not present

## 2023-02-01 DIAGNOSIS — H01001 Unspecified blepharitis right upper eyelid: Secondary | ICD-10-CM | POA: Diagnosis not present

## 2023-02-08 DIAGNOSIS — Z882 Allergy status to sulfonamides status: Secondary | ICD-10-CM | POA: Diagnosis not present

## 2023-02-08 DIAGNOSIS — Z87891 Personal history of nicotine dependence: Secondary | ICD-10-CM | POA: Diagnosis not present

## 2023-02-08 DIAGNOSIS — Z7982 Long term (current) use of aspirin: Secondary | ICD-10-CM | POA: Diagnosis not present

## 2023-02-08 DIAGNOSIS — I252 Old myocardial infarction: Secondary | ICD-10-CM | POA: Diagnosis not present

## 2023-02-08 DIAGNOSIS — E7800 Pure hypercholesterolemia, unspecified: Secondary | ICD-10-CM | POA: Diagnosis not present

## 2023-02-08 DIAGNOSIS — R42 Dizziness and giddiness: Secondary | ICD-10-CM | POA: Diagnosis not present

## 2023-02-08 DIAGNOSIS — Z951 Presence of aortocoronary bypass graft: Secondary | ICD-10-CM | POA: Diagnosis not present

## 2023-02-08 DIAGNOSIS — Z880 Allergy status to penicillin: Secondary | ICD-10-CM | POA: Diagnosis not present

## 2023-02-08 DIAGNOSIS — I10 Essential (primary) hypertension: Secondary | ICD-10-CM | POA: Diagnosis not present

## 2023-02-08 DIAGNOSIS — Z885 Allergy status to narcotic agent status: Secondary | ICD-10-CM | POA: Diagnosis not present

## 2023-02-08 DIAGNOSIS — Z888 Allergy status to other drugs, medicaments and biological substances status: Secondary | ICD-10-CM | POA: Diagnosis not present

## 2023-02-08 DIAGNOSIS — M479 Spondylosis, unspecified: Secondary | ICD-10-CM | POA: Diagnosis not present

## 2023-02-08 DIAGNOSIS — Z881 Allergy status to other antibiotic agents status: Secondary | ICD-10-CM | POA: Diagnosis not present

## 2023-02-08 DIAGNOSIS — Z955 Presence of coronary angioplasty implant and graft: Secondary | ICD-10-CM | POA: Diagnosis not present

## 2023-02-08 DIAGNOSIS — Z79899 Other long term (current) drug therapy: Secondary | ICD-10-CM | POA: Diagnosis not present

## 2023-02-08 DIAGNOSIS — R9082 White matter disease, unspecified: Secondary | ICD-10-CM | POA: Diagnosis not present

## 2023-02-08 DIAGNOSIS — R519 Headache, unspecified: Secondary | ICD-10-CM | POA: Diagnosis not present

## 2023-02-10 DIAGNOSIS — M542 Cervicalgia: Secondary | ICD-10-CM | POA: Diagnosis not present

## 2023-02-10 DIAGNOSIS — R110 Nausea: Secondary | ICD-10-CM | POA: Diagnosis not present

## 2023-02-10 DIAGNOSIS — E782 Mixed hyperlipidemia: Secondary | ICD-10-CM | POA: Diagnosis not present

## 2023-02-10 DIAGNOSIS — K649 Unspecified hemorrhoids: Secondary | ICD-10-CM | POA: Diagnosis not present

## 2023-02-10 DIAGNOSIS — J449 Chronic obstructive pulmonary disease, unspecified: Secondary | ICD-10-CM | POA: Diagnosis not present

## 2023-02-10 DIAGNOSIS — I10 Essential (primary) hypertension: Secondary | ICD-10-CM | POA: Diagnosis not present

## 2023-02-10 DIAGNOSIS — K219 Gastro-esophageal reflux disease without esophagitis: Secondary | ICD-10-CM | POA: Diagnosis not present

## 2023-02-10 DIAGNOSIS — E876 Hypokalemia: Secondary | ICD-10-CM | POA: Diagnosis not present

## 2023-02-10 DIAGNOSIS — K5900 Constipation, unspecified: Secondary | ICD-10-CM | POA: Diagnosis not present

## 2023-02-10 DIAGNOSIS — I2510 Atherosclerotic heart disease of native coronary artery without angina pectoris: Secondary | ICD-10-CM | POA: Diagnosis not present

## 2023-02-10 DIAGNOSIS — Z6823 Body mass index (BMI) 23.0-23.9, adult: Secondary | ICD-10-CM | POA: Diagnosis not present

## 2023-02-10 DIAGNOSIS — R69 Illness, unspecified: Secondary | ICD-10-CM | POA: Diagnosis not present

## 2023-02-10 DIAGNOSIS — J309 Allergic rhinitis, unspecified: Secondary | ICD-10-CM | POA: Diagnosis not present

## 2023-02-10 DIAGNOSIS — E7849 Other hyperlipidemia: Secondary | ICD-10-CM | POA: Diagnosis not present

## 2023-02-17 DIAGNOSIS — Z1329 Encounter for screening for other suspected endocrine disorder: Secondary | ICD-10-CM | POA: Diagnosis not present

## 2023-02-17 DIAGNOSIS — R202 Paresthesia of skin: Secondary | ICD-10-CM | POA: Diagnosis not present

## 2023-02-17 DIAGNOSIS — R110 Nausea: Secondary | ICD-10-CM | POA: Diagnosis not present

## 2023-02-17 DIAGNOSIS — R5383 Other fatigue: Secondary | ICD-10-CM | POA: Diagnosis not present

## 2023-02-17 DIAGNOSIS — K5900 Constipation, unspecified: Secondary | ICD-10-CM | POA: Diagnosis not present

## 2023-02-22 DIAGNOSIS — Z961 Presence of intraocular lens: Secondary | ICD-10-CM | POA: Diagnosis not present

## 2023-02-25 DIAGNOSIS — R5383 Other fatigue: Secondary | ICD-10-CM | POA: Diagnosis not present

## 2023-02-25 DIAGNOSIS — E876 Hypokalemia: Secondary | ICD-10-CM | POA: Diagnosis not present

## 2023-02-25 DIAGNOSIS — I2510 Atherosclerotic heart disease of native coronary artery without angina pectoris: Secondary | ICD-10-CM | POA: Diagnosis not present

## 2023-03-16 DIAGNOSIS — I10 Essential (primary) hypertension: Secondary | ICD-10-CM | POA: Diagnosis not present

## 2023-03-16 DIAGNOSIS — E782 Mixed hyperlipidemia: Secondary | ICD-10-CM | POA: Diagnosis not present

## 2023-03-16 DIAGNOSIS — J449 Chronic obstructive pulmonary disease, unspecified: Secondary | ICD-10-CM | POA: Diagnosis not present

## 2023-03-16 DIAGNOSIS — K219 Gastro-esophageal reflux disease without esophagitis: Secondary | ICD-10-CM | POA: Diagnosis not present

## 2023-04-05 DIAGNOSIS — L539 Erythematous condition, unspecified: Secondary | ICD-10-CM | POA: Diagnosis not present

## 2023-04-05 DIAGNOSIS — H01004 Unspecified blepharitis left upper eyelid: Secondary | ICD-10-CM | POA: Diagnosis not present

## 2023-05-17 DIAGNOSIS — L719 Rosacea, unspecified: Secondary | ICD-10-CM | POA: Diagnosis not present

## 2023-05-17 DIAGNOSIS — D485 Neoplasm of uncertain behavior of skin: Secondary | ICD-10-CM | POA: Diagnosis not present

## 2023-05-17 DIAGNOSIS — L570 Actinic keratosis: Secondary | ICD-10-CM | POA: Diagnosis not present

## 2023-07-01 DIAGNOSIS — H40013 Open angle with borderline findings, low risk, bilateral: Secondary | ICD-10-CM | POA: Diagnosis not present

## 2023-07-12 DIAGNOSIS — K5900 Constipation, unspecified: Secondary | ICD-10-CM | POA: Diagnosis not present

## 2023-07-12 DIAGNOSIS — J449 Chronic obstructive pulmonary disease, unspecified: Secondary | ICD-10-CM | POA: Diagnosis not present

## 2023-07-12 DIAGNOSIS — K219 Gastro-esophageal reflux disease without esophagitis: Secondary | ICD-10-CM | POA: Diagnosis not present

## 2023-07-12 DIAGNOSIS — F419 Anxiety disorder, unspecified: Secondary | ICD-10-CM | POA: Diagnosis not present

## 2023-07-12 DIAGNOSIS — M542 Cervicalgia: Secondary | ICD-10-CM | POA: Diagnosis not present

## 2023-07-12 DIAGNOSIS — I2510 Atherosclerotic heart disease of native coronary artery without angina pectoris: Secondary | ICD-10-CM | POA: Diagnosis not present

## 2023-07-12 DIAGNOSIS — E7849 Other hyperlipidemia: Secondary | ICD-10-CM | POA: Diagnosis not present

## 2023-07-12 DIAGNOSIS — I10 Essential (primary) hypertension: Secondary | ICD-10-CM | POA: Diagnosis not present

## 2023-07-12 DIAGNOSIS — K649 Unspecified hemorrhoids: Secondary | ICD-10-CM | POA: Diagnosis not present

## 2023-09-06 DIAGNOSIS — J441 Chronic obstructive pulmonary disease with (acute) exacerbation: Secondary | ICD-10-CM | POA: Diagnosis not present

## 2023-09-06 DIAGNOSIS — Z1329 Encounter for screening for other suspected endocrine disorder: Secondary | ICD-10-CM | POA: Diagnosis not present

## 2023-09-06 DIAGNOSIS — Z1321 Encounter for screening for nutritional disorder: Secondary | ICD-10-CM | POA: Diagnosis not present

## 2023-09-06 DIAGNOSIS — E7849 Other hyperlipidemia: Secondary | ICD-10-CM | POA: Diagnosis not present

## 2023-09-06 DIAGNOSIS — I10 Essential (primary) hypertension: Secondary | ICD-10-CM | POA: Diagnosis not present

## 2023-09-06 DIAGNOSIS — E876 Hypokalemia: Secondary | ICD-10-CM | POA: Diagnosis not present

## 2023-09-06 DIAGNOSIS — Z131 Encounter for screening for diabetes mellitus: Secondary | ICD-10-CM | POA: Diagnosis not present

## 2023-09-13 DIAGNOSIS — K219 Gastro-esophageal reflux disease without esophagitis: Secondary | ICD-10-CM | POA: Diagnosis not present

## 2023-09-13 DIAGNOSIS — J449 Chronic obstructive pulmonary disease, unspecified: Secondary | ICD-10-CM | POA: Diagnosis not present

## 2023-09-13 DIAGNOSIS — F419 Anxiety disorder, unspecified: Secondary | ICD-10-CM | POA: Diagnosis not present

## 2023-09-13 DIAGNOSIS — M542 Cervicalgia: Secondary | ICD-10-CM | POA: Diagnosis not present

## 2023-09-13 DIAGNOSIS — I10 Essential (primary) hypertension: Secondary | ICD-10-CM | POA: Diagnosis not present

## 2023-09-13 DIAGNOSIS — K5900 Constipation, unspecified: Secondary | ICD-10-CM | POA: Diagnosis not present

## 2023-09-13 DIAGNOSIS — K649 Unspecified hemorrhoids: Secondary | ICD-10-CM | POA: Diagnosis not present

## 2023-09-13 DIAGNOSIS — I2510 Atherosclerotic heart disease of native coronary artery without angina pectoris: Secondary | ICD-10-CM | POA: Diagnosis not present

## 2023-09-13 DIAGNOSIS — E7849 Other hyperlipidemia: Secondary | ICD-10-CM | POA: Diagnosis not present

## 2023-09-13 DIAGNOSIS — E782 Mixed hyperlipidemia: Secondary | ICD-10-CM | POA: Diagnosis not present

## 2023-10-15 DIAGNOSIS — R609 Edema, unspecified: Secondary | ICD-10-CM | POA: Diagnosis not present

## 2023-10-15 DIAGNOSIS — I10 Essential (primary) hypertension: Secondary | ICD-10-CM | POA: Diagnosis not present

## 2023-10-15 DIAGNOSIS — I209 Angina pectoris, unspecified: Secondary | ICD-10-CM | POA: Diagnosis not present

## 2023-10-15 DIAGNOSIS — J449 Chronic obstructive pulmonary disease, unspecified: Secondary | ICD-10-CM | POA: Diagnosis not present

## 2023-10-25 DIAGNOSIS — Z9861 Coronary angioplasty status: Secondary | ICD-10-CM | POA: Diagnosis not present

## 2023-10-25 DIAGNOSIS — I10 Essential (primary) hypertension: Secondary | ICD-10-CM | POA: Diagnosis not present

## 2023-10-25 DIAGNOSIS — I214 Non-ST elevation (NSTEMI) myocardial infarction: Secondary | ICD-10-CM | POA: Diagnosis not present

## 2024-03-15 DIAGNOSIS — I10 Essential (primary) hypertension: Secondary | ICD-10-CM | POA: Diagnosis not present

## 2024-03-15 DIAGNOSIS — K219 Gastro-esophageal reflux disease without esophagitis: Secondary | ICD-10-CM | POA: Diagnosis not present

## 2024-03-15 DIAGNOSIS — J449 Chronic obstructive pulmonary disease, unspecified: Secondary | ICD-10-CM | POA: Diagnosis not present

## 2024-03-28 DIAGNOSIS — J449 Chronic obstructive pulmonary disease, unspecified: Secondary | ICD-10-CM | POA: Diagnosis not present

## 2024-03-28 DIAGNOSIS — Z885 Allergy status to narcotic agent status: Secondary | ICD-10-CM | POA: Diagnosis not present

## 2024-03-28 DIAGNOSIS — Z880 Allergy status to penicillin: Secondary | ICD-10-CM | POA: Diagnosis not present

## 2024-03-28 DIAGNOSIS — R0789 Other chest pain: Secondary | ICD-10-CM | POA: Diagnosis not present

## 2024-03-28 DIAGNOSIS — R079 Chest pain, unspecified: Secondary | ICD-10-CM | POA: Diagnosis not present

## 2024-03-28 DIAGNOSIS — R9082 White matter disease, unspecified: Secondary | ICD-10-CM | POA: Diagnosis not present

## 2024-03-28 DIAGNOSIS — Z79899 Other long term (current) drug therapy: Secondary | ICD-10-CM | POA: Diagnosis not present

## 2024-03-28 DIAGNOSIS — Z1321 Encounter for screening for nutritional disorder: Secondary | ICD-10-CM | POA: Diagnosis not present

## 2024-03-28 DIAGNOSIS — D649 Anemia, unspecified: Secondary | ICD-10-CM | POA: Diagnosis not present

## 2024-03-28 DIAGNOSIS — G9389 Other specified disorders of brain: Secondary | ICD-10-CM | POA: Diagnosis not present

## 2024-03-28 DIAGNOSIS — Z7951 Long term (current) use of inhaled steroids: Secondary | ICD-10-CM | POA: Diagnosis not present

## 2024-03-28 DIAGNOSIS — E7800 Pure hypercholesterolemia, unspecified: Secondary | ICD-10-CM | POA: Diagnosis not present

## 2024-03-28 DIAGNOSIS — Z87891 Personal history of nicotine dependence: Secondary | ICD-10-CM | POA: Diagnosis not present

## 2024-03-28 DIAGNOSIS — Z7982 Long term (current) use of aspirin: Secondary | ICD-10-CM | POA: Diagnosis not present

## 2024-03-28 DIAGNOSIS — R739 Hyperglycemia, unspecified: Secondary | ICD-10-CM | POA: Diagnosis not present

## 2024-03-28 DIAGNOSIS — Z1329 Encounter for screening for other suspected endocrine disorder: Secondary | ICD-10-CM | POA: Diagnosis not present

## 2024-03-28 DIAGNOSIS — I10 Essential (primary) hypertension: Secondary | ICD-10-CM | POA: Diagnosis not present

## 2024-03-28 DIAGNOSIS — I2510 Atherosclerotic heart disease of native coronary artery without angina pectoris: Secondary | ICD-10-CM | POA: Diagnosis not present

## 2024-03-28 DIAGNOSIS — Z0000 Encounter for general adult medical examination without abnormal findings: Secondary | ICD-10-CM | POA: Diagnosis not present

## 2024-03-28 DIAGNOSIS — E785 Hyperlipidemia, unspecified: Secondary | ICD-10-CM | POA: Diagnosis not present

## 2024-03-28 DIAGNOSIS — I7090 Unspecified atherosclerosis: Secondary | ICD-10-CM | POA: Diagnosis not present

## 2024-03-28 DIAGNOSIS — I672 Cerebral atherosclerosis: Secondary | ICD-10-CM | POA: Diagnosis not present

## 2024-03-28 DIAGNOSIS — Z882 Allergy status to sulfonamides status: Secondary | ICD-10-CM | POA: Diagnosis not present

## 2024-03-28 DIAGNOSIS — M47812 Spondylosis without myelopathy or radiculopathy, cervical region: Secondary | ICD-10-CM | POA: Diagnosis not present

## 2024-03-28 DIAGNOSIS — R42 Dizziness and giddiness: Secondary | ICD-10-CM | POA: Diagnosis not present

## 2024-03-29 DIAGNOSIS — R079 Chest pain, unspecified: Secondary | ICD-10-CM | POA: Diagnosis not present

## 2024-03-29 DIAGNOSIS — G9389 Other specified disorders of brain: Secondary | ICD-10-CM | POA: Diagnosis not present

## 2024-03-29 DIAGNOSIS — I672 Cerebral atherosclerosis: Secondary | ICD-10-CM | POA: Diagnosis not present

## 2024-03-29 DIAGNOSIS — M47812 Spondylosis without myelopathy or radiculopathy, cervical region: Secondary | ICD-10-CM | POA: Diagnosis not present

## 2024-03-30 ENCOUNTER — Encounter (INDEPENDENT_AMBULATORY_CARE_PROVIDER_SITE_OTHER): Payer: Self-pay | Admitting: *Deleted

## 2024-03-30 DIAGNOSIS — I2510 Atherosclerotic heart disease of native coronary artery without angina pectoris: Secondary | ICD-10-CM | POA: Diagnosis not present

## 2024-03-30 DIAGNOSIS — I10 Essential (primary) hypertension: Secondary | ICD-10-CM | POA: Diagnosis not present

## 2024-04-06 DIAGNOSIS — M25562 Pain in left knee: Secondary | ICD-10-CM | POA: Diagnosis not present

## 2024-04-06 DIAGNOSIS — M545 Low back pain, unspecified: Secondary | ICD-10-CM | POA: Diagnosis not present

## 2024-04-07 DIAGNOSIS — I10 Essential (primary) hypertension: Secondary | ICD-10-CM | POA: Diagnosis not present

## 2024-04-07 DIAGNOSIS — R609 Edema, unspecified: Secondary | ICD-10-CM | POA: Diagnosis not present

## 2024-04-07 DIAGNOSIS — I2510 Atherosclerotic heart disease of native coronary artery without angina pectoris: Secondary | ICD-10-CM | POA: Diagnosis not present

## 2024-04-07 DIAGNOSIS — E785 Hyperlipidemia, unspecified: Secondary | ICD-10-CM | POA: Diagnosis not present

## 2024-04-14 DIAGNOSIS — J449 Chronic obstructive pulmonary disease, unspecified: Secondary | ICD-10-CM | POA: Diagnosis not present

## 2024-04-14 DIAGNOSIS — K219 Gastro-esophageal reflux disease without esophagitis: Secondary | ICD-10-CM | POA: Diagnosis not present

## 2024-04-14 DIAGNOSIS — I10 Essential (primary) hypertension: Secondary | ICD-10-CM | POA: Diagnosis not present

## 2024-04-20 DIAGNOSIS — R42 Dizziness and giddiness: Secondary | ICD-10-CM | POA: Diagnosis not present

## 2024-04-20 DIAGNOSIS — H9313 Tinnitus, bilateral: Secondary | ICD-10-CM | POA: Diagnosis not present

## 2024-04-24 ENCOUNTER — Encounter: Payer: Self-pay | Admitting: *Deleted

## 2024-04-24 ENCOUNTER — Ambulatory Visit (INDEPENDENT_AMBULATORY_CARE_PROVIDER_SITE_OTHER): Admitting: Gastroenterology

## 2024-04-24 ENCOUNTER — Encounter (INDEPENDENT_AMBULATORY_CARE_PROVIDER_SITE_OTHER): Payer: Self-pay | Admitting: Gastroenterology

## 2024-04-24 VITALS — BP 145/77 | HR 60 | Temp 98.4°F | Ht 60.0 in | Wt 138.9 lb

## 2024-04-24 DIAGNOSIS — R194 Change in bowel habit: Secondary | ICD-10-CM | POA: Insufficient documentation

## 2024-04-24 DIAGNOSIS — R112 Nausea with vomiting, unspecified: Secondary | ICD-10-CM

## 2024-04-24 DIAGNOSIS — K5900 Constipation, unspecified: Secondary | ICD-10-CM | POA: Diagnosis not present

## 2024-04-24 MED ORDER — PROMETHAZINE HCL 12.5 MG PO TABS
12.5000 mg | ORAL_TABLET | Freq: Four times a day (QID) | ORAL | 0 refills | Status: AC | PRN
Start: 2024-04-24 — End: ?

## 2024-04-24 NOTE — Progress Notes (Unsigned)
 Samantha Cress, M.D. Gastroenterology & Hepatology [facility] Gastroenterology [address] [place], [place] [address] Primary Care Physician: Skillman, Katherine E, PA-C [address] [place] [address]  Referring MD: PCP  Chief Complaint:  constipation  History of Present Illness: Jodi Spencer is a 83 y.o. female with past medical history of GERD and fibromyalgia, who presents for evaluation of constipation.  Patient reports that she has presented recurrent episodes of constipation for multiple years. She states that she has had issues with constipation for at the very least 30 years but they have worsened recently. Having a BM every other week. She reports that she has had tenesmus . She states that she has had very small amount of stools, which have a jelly like appearance and they are small amount. No melena or hematochezia.  States that she has noted some intermittent left lower quadrant abdominal pain but this is not frequent.  The patient was seen in our office on 12/30/2020.  At that time she was prescribed Linzess  72 mcg every day and was advised to increase the intake of kiwi. She reports that she has tried Linzess  72 mcg. States it did not work to her.  No longer taking as it is too expensive and she did not feel it was leading to significant improvement in her bowel movement frequency. She started using Emma supplements 2 weeks ago.  Has noticed some partial improvement with this.  Previously used product: Miralax, Linzess  72 mcg, Emma supplements, OTC  The patient denies having any nausea, vomiting, fever, chills, hematochezia, melena, hematemesis, abdominal distention, diarrhea, jaundice, pruritus or weight loss.  Most recent labs from 09/07/2023 showed CBC with WBC 6.4, platelets 298, hemoglobin 13.7, CMP with ALT 7.0, AST 15.0, normal electrolytes, alkaline phosphatase 62, total bilirubin 0.6, creatinine 0.92, BUN 17.0, TSH 2.23, vitamin D  54.  Patient wants to hold off on starting any new medications until further workup is performed for her constipation.  Last Colonoscopy: Performed by Dr. Alline Ivans on 04/09/2015.  Had presence of a polyp in the hepatic flexure measuring 5 to 9 mm which was removed.  Recommend to have a repeat colonoscopy in 5 years.  Past Medical History: Past Medical History:  Diagnosis Date   Fibromyalgia    GERD (gastroesophageal reflux disease)     Past Surgical History: Past Surgical History:  Procedure Laterality Date   BREAST SURGERY Right    lumpectomy   CHOLECYSTECTOMY  04/20178   [facility]   LEFT HEART CATHETERIZATION WITH CORONARY ANGIOGRAM N/A 03/02/2014   Procedure: LEFT HEART CATHETERIZATION WITH CORONARY ANGIOGRAM;  Surgeon: Lucendia Rusk, MD;  Location[facility] CATH LAB;  Service: Cardiovascular;  Laterality: N/A;   PERCUTANEOUS CORONARY STENT INTERVENTION (PCI-S)  03/02/2014   Procedure: PERCUTANEOUS CORONARY STENT INTERVENTION (PCI-S);  Surgeon: Lucendia Rusk, MD;  Location: [facility] CATH LAB;  Service: Cardiovascular;;    Family History: Family History  Problem Relation Age of Onset   Coronary artery disease Sister 25       MI    Social History: Social History   Tobacco Use  Smoking Status Former   Current packs/day: 0.00   Average packs/day: 2.0 packs/day for 45.0 years (89.9 ttl pk-yrs)   Types: Cigarettes   Start date: 10/23/1953   Quit date: 08/30/1998   Years since quitting: 25.6  Smokeless Tobacco Never   Social History   Substance and Sexual Activity  Alcohol Use Not Currently   Alcohol/week: 2.0 standard drinks of  alcohol   Types: 2 Glasses of wine per week   Social History   Substance and Sexual Activity  Drug Use Not Currently    Allergies: Allergies  Allergen Reactions   Codeine Nausea Only   Erythromycin Nausea And Vomiting   Nsaids Nausea Only   Sulfa Antibiotics Other (See Comments)    Dry mouth    Penicillins Itching and Rash     Medications: Current Outpatient Medications  Medication Sig Dispense Refill   Acetaminophen  (TYLENOL  ARTHRITIS PAIN PO) Take 2 tablets by mouth at bedtime.     albuterol (PROVENTIL) (2.5 MG/3ML) 0.083% nebulizer solution Inhale into the lungs.     ALPRAZolam  (XANAX ) 1 MG tablet Take 0.5 tablets by mouth at bedtime as needed.     amLODipine  (NORVASC ) 5 MG tablet Take 1 tablet (5 mg total) by mouth daily. (Patient taking differently: Take 10 mg by mouth daily at 6 (six) AM.) 90 tablet 3   aspirin  81 MG tablet Take 81 mg by mouth daily.     Glycerin-Hypromellose-PEG 400 (DRY EYE RELIEF DROPS OP) Apply to eye as needed.     ipratropium (ATROVENT) 0.06 % nasal spray Place 2 sprays into both nostrils 4 (four) times daily.     linaclotide  (LINZESS ) 72 MCG capsule Take 1 capsule (72 mcg total) by mouth daily before breakfast. 90 capsule 3   metroNIDAZOLE (METROCREAM) 0.75 % cream Apply topically daily. As needed.     Multiple Vitamins-Minerals (PRESERVISION AREDS PO) Take 1 tablet by mouth daily.     nitroGLYCERIN  (NITROSTAT ) 0.4 MG SL tablet Place 1 tablet (0.4 mg total) under the tongue every 5 (five) minutes x 3 doses as needed for chest pain. 25 tablet 2   omeprazole (PRILOSEC) 20 MG capsule Take 20 mg by mouth daily.      mupirocin ointment (BACTROBAN) 2 % 1 application daily. As needed. (Patient not taking: Reported on 04/24/2024)     OVER THE COUNTER MEDICATION Flovent HFA two puffs BID (Patient not taking: Reported on 04/24/2024)     No current facility-administered medications for this visit.    Review of Systems: GENERAL: negative for malaise, night sweats HEENT: No changes in hearing or vision, no nose bleeds or other nasal problems. NECK: Negative for lumps, goiter, pain and significant neck swelling RESPIRATORY: Negative for cough, wheezing CARDIOVASCULAR: Negative for chest pain, leg swelling, palpitations, orthopnea GI: SEE HPI MUSCULOSKELETAL: Negative for joint pain or  swelling, back pain, and muscle pain. SKIN: Negative for lesions, rash PSYCH: Negative for sleep disturbance, mood disorder and recent psychosocial stressors. HEMATOLOGY Negative for prolonged bleeding, bruising easily, and swollen nodes. ENDOCRINE: Negative for cold or heat intolerance, polyuria, polydipsia and goiter. NEURO: negative for tremor, gait imbalance, syncope and seizures. The remainder of the review of systems is noncontributory.   Physical Exam: BP (!) 147/82 (BP Location: Left Arm, Patient Position: Sitting, Cuff Size: Normal)   Pulse 80   Temp 98.4 F (36.9 C) (Temporal)   Ht 5' (1.524 m)   Wt 138 lb 14.4 oz (63 kg)   BMI 27.13 kg/m  GENERAL: The patient is AO x3, in no acute distress. HEENT: Head is normocephalic and atraumatic. EOMI are intact. Mouth is well hydrated and without lesions. NECK: Supple. No masses LUNGS: Clear to auscultation. No presence of rhonchi/wheezing/rales. Adequate chest expansion HEART: RRR, normal s1 and s2. ABDOMEN: Soft, nontender, no guarding, no peritoneal signs, and nondistended. BS +. No masses. EXTREMITIES: Without any cyanosis, clubbing, rash, lesions or edema. NEUROLOGIC:  AOx3, no focal motor deficit. SKIN: no jaundice, no rashes   Imaging/Labs: as above  I personally reviewed and interpreted the available labs, imaging and endoscopic files.  Impression and Plan: Jodi Spencer is a 83 y.o. female with past medical history of GERD and fibromyalgia, who presents for evaluation of constipation.  Patient has presented chronic issues with constipation that did not improve with the use of low-dose Linzess .  She has had recent exacerbation of her symptoms which have been concerning for her.  Unfortunately, she did not follow up in the clinic after her last appointment and has only been trying over-the-counter laxatives without success.  We discussed the possibility of evaluating intraluminal pathologies with colonoscopy which she is  agreeable to proceed with.  We also discussed that given the chronicity of her symptoms, she could have dyssynergic defecation versus pelvic floor dysfunction, for which we may consider evaluating this further with an anorectal manometry.  I offered starting higher dose of Linzess  or other prescription laxatives but she declined this as she wants to perform workup for other etiologies of constipation prior to starting these medications.  -Schedule colonoscopy -If normal colonoscopy, we will proceed with anorectal manometry  All questions were answered.      Samantha Cress, MD Gastroenterology and Hepatology [facility] Gastroenterology

## 2024-04-24 NOTE — Patient Instructions (Addendum)
 Schedule colonoscopy If normal colonoscopy, we will proceed with anorectal manometry

## 2024-05-15 DIAGNOSIS — K219 Gastro-esophageal reflux disease without esophagitis: Secondary | ICD-10-CM | POA: Diagnosis not present

## 2024-05-15 DIAGNOSIS — J449 Chronic obstructive pulmonary disease, unspecified: Secondary | ICD-10-CM | POA: Diagnosis not present

## 2024-05-15 DIAGNOSIS — H353124 Nonexudative age-related macular degeneration, left eye, advanced atrophic with subfoveal involvement: Secondary | ICD-10-CM | POA: Diagnosis not present

## 2024-05-15 DIAGNOSIS — I10 Essential (primary) hypertension: Secondary | ICD-10-CM | POA: Diagnosis not present

## 2024-05-18 DIAGNOSIS — R2689 Other abnormalities of gait and mobility: Secondary | ICD-10-CM | POA: Diagnosis not present

## 2024-05-18 DIAGNOSIS — K5900 Constipation, unspecified: Secondary | ICD-10-CM | POA: Diagnosis not present

## 2024-05-22 ENCOUNTER — Other Ambulatory Visit (INDEPENDENT_AMBULATORY_CARE_PROVIDER_SITE_OTHER): Payer: Self-pay | Admitting: Gastroenterology

## 2024-05-22 ENCOUNTER — Telehealth: Payer: Self-pay | Admitting: *Deleted

## 2024-05-22 MED ORDER — ONDANSETRON HCL 4 MG PO TABS: 4.0000 mg | ORAL_TABLET | Freq: Three times a day (TID) | ORAL | 1 refills | Status: AC | PRN

## 2024-05-22 NOTE — Telephone Encounter (Signed)
 Medication sent to pharmacy

## 2024-05-22 NOTE — Telephone Encounter (Signed)
 Pt says she needs nausea medication to be sent into [facility] in [place], [place]

## 2024-05-22 NOTE — Telephone Encounter (Signed)
 LMOVM that medication sent to pharmacy. If any questions to call back

## 2024-05-31 DIAGNOSIS — S83242A Other tear of medial meniscus, current injury, left knee, initial encounter: Secondary | ICD-10-CM | POA: Diagnosis not present

## 2024-05-31 DIAGNOSIS — M7122 Synovial cyst of popliteal space [Baker], left knee: Secondary | ICD-10-CM | POA: Diagnosis not present

## 2024-05-31 DIAGNOSIS — M1712 Unilateral primary osteoarthritis, left knee: Secondary | ICD-10-CM | POA: Diagnosis not present

## 2024-06-07 ENCOUNTER — Ambulatory Visit (HOSPITAL_COMMUNITY): Admitting: Anesthesiology

## 2024-06-07 ENCOUNTER — Other Ambulatory Visit: Payer: Self-pay

## 2024-06-07 ENCOUNTER — Telehealth (INDEPENDENT_AMBULATORY_CARE_PROVIDER_SITE_OTHER): Payer: Self-pay | Admitting: *Deleted

## 2024-06-07 ENCOUNTER — Ambulatory Visit (HOSPITAL_COMMUNITY)
Admission: RE | Admit: 2024-06-07 | Discharge: 2024-06-07 | Disposition: A | Discharge status: Home / Self Care | Attending: Gastroenterology | Admitting: Gastroenterology

## 2024-06-07 ENCOUNTER — Encounter (HOSPITAL_COMMUNITY)
Admission: RE | Disposition: A | Discharge status: Home / Self Care | Payer: Self-pay | Source: Home / Self Care | Attending: Gastroenterology

## 2024-06-07 ENCOUNTER — Encounter (HOSPITAL_COMMUNITY): Payer: Self-pay | Admitting: Gastroenterology

## 2024-06-07 DIAGNOSIS — Z87891 Personal history of nicotine dependence: Secondary | ICD-10-CM | POA: Diagnosis not present

## 2024-06-07 DIAGNOSIS — I252 Old myocardial infarction: Secondary | ICD-10-CM | POA: Insufficient documentation

## 2024-06-07 DIAGNOSIS — I10 Essential (primary) hypertension: Secondary | ICD-10-CM | POA: Diagnosis not present

## 2024-06-07 DIAGNOSIS — Z8249 Family history of ischemic heart disease and other diseases of the circulatory system: Secondary | ICD-10-CM | POA: Insufficient documentation

## 2024-06-07 DIAGNOSIS — K5730 Diverticulosis of large intestine without perforation or abscess without bleeding: Secondary | ICD-10-CM

## 2024-06-07 DIAGNOSIS — J449 Chronic obstructive pulmonary disease, unspecified: Secondary | ICD-10-CM | POA: Diagnosis not present

## 2024-06-07 DIAGNOSIS — I2510 Atherosclerotic heart disease of native coronary artery without angina pectoris: Secondary | ICD-10-CM

## 2024-06-07 DIAGNOSIS — K644 Residual hemorrhoidal skin tags: Secondary | ICD-10-CM | POA: Insufficient documentation

## 2024-06-07 DIAGNOSIS — K5900 Constipation, unspecified: Secondary | ICD-10-CM | POA: Diagnosis not present

## 2024-06-07 DIAGNOSIS — K648 Other hemorrhoids: Secondary | ICD-10-CM | POA: Diagnosis not present

## 2024-06-07 DIAGNOSIS — K219 Gastro-esophageal reflux disease without esophagitis: Secondary | ICD-10-CM | POA: Insufficient documentation

## 2024-06-07 DIAGNOSIS — K635 Polyp of colon: Secondary | ICD-10-CM | POA: Diagnosis not present

## 2024-06-07 DIAGNOSIS — R194 Change in bowel habit: Secondary | ICD-10-CM | POA: Diagnosis not present

## 2024-06-07 DIAGNOSIS — D122 Benign neoplasm of ascending colon: Secondary | ICD-10-CM | POA: Diagnosis not present

## 2024-06-07 DIAGNOSIS — R112 Nausea with vomiting, unspecified: Secondary | ICD-10-CM

## 2024-06-07 DIAGNOSIS — M797 Fibromyalgia: Secondary | ICD-10-CM | POA: Diagnosis not present

## 2024-06-07 DIAGNOSIS — G709 Myoneural disorder, unspecified: Secondary | ICD-10-CM | POA: Diagnosis not present

## 2024-06-07 HISTORY — PX: COLONOSCOPY: SHX5424

## 2024-06-07 HISTORY — DX: Chronic obstructive pulmonary disease, unspecified: J44.9

## 2024-06-07 LAB — HM COLONOSCOPY

## 2024-06-07 SURGERY — COLONOSCOPY
Anesthesia: General

## 2024-06-07 MED ORDER — PHENYLEPHRINE 80 MCG/ML (10ML) SYRINGE FOR IV PUSH (FOR BLOOD PRESSURE SUPPORT)
PREFILLED_SYRINGE | INTRAVENOUS | Status: DC | PRN
  Administered 2024-06-07: 160 ug via INTRAVENOUS
  Administered 2024-06-07: 80 ug via INTRAVENOUS

## 2024-06-07 MED ORDER — PROPOFOL 10 MG/ML IV BOLUS
INTRAVENOUS | Status: DC | PRN
  Administered 2024-06-07: 125 ug/kg/min via INTRAVENOUS
  Administered 2024-06-07: 60 mg via INTRAVENOUS

## 2024-06-07 MED ORDER — EPHEDRINE SULFATE-NACL 50-0.9 MG/10ML-% IV SOSY
PREFILLED_SYRINGE | INTRAVENOUS | Status: DC | PRN
  Administered 2024-06-07: 10 mg via INTRAVENOUS

## 2024-06-07 MED ORDER — LACTATED RINGERS IV SOLN: INTRAVENOUS | Status: DC

## 2024-06-07 MED ORDER — LIDOCAINE 2% (20 MG/ML) 5 ML SYRINGE
INTRAMUSCULAR | Status: DC | PRN
  Administered 2024-06-07: 40 mg via INTRAVENOUS

## 2024-06-07 MED ORDER — LUBIPROSTONE 24 MCG PO CAPS: 24.0000 ug | ORAL_CAPSULE | Freq: Two times a day (BID) | ORAL | 3 refills | Status: AC

## 2024-06-07 NOTE — Discharge Instructions (Addendum)
 You are being discharged to home.  Resume your previous diet.  We are waiting for your pathology results.  Your physician has indicated that a repeat colonoscopy is not recommended due to your current age (78 years or older) for screening purposes.  Referral for anorectal manometry at [facility]. Start Amitiza  24 mcg twice a day.

## 2024-06-07 NOTE — Telephone Encounter (Signed)
 Referral sent, they will contact patient with apt

## 2024-06-07 NOTE — H&P (Signed)
 Jodi Spencer is an 83 y.o. female.   Chief Complaint: constipation HPI: Jodi Spencer is a 83 y.o. female with past medical history of GERD and fibromyalgia, who presents for evaluation of constipation.   Patient states still having issues with constipation as she does not feel significant urge to have bowel movements and has to strain to move her bowels on a daily basis. The patient denies having any nausea, vomiting, fever, chills, hematochezia, melena, hematemesis, abdominal distention, abdominal pain, diarrhea, jaundice, pruritus or weight loss.  Past Medical History:  Diagnosis Date   COPD (chronic obstructive pulmonary disease) (HCC)    Fibromyalgia    GERD (gastroesophageal reflux disease)     Past Surgical History:  Procedure Laterality Date   BREAST SURGERY Right    lumpectomy   CHOLECYSTECTOMY  04/20178   [facility]   LEFT HEART CATHETERIZATION WITH CORONARY ANGIOGRAM N/A 03/02/2014   Procedure: LEFT HEART CATHETERIZATION WITH CORONARY ANGIOGRAM;  Surgeon: Candyce GORMAN Reek, MD;  Location: [facility] CATH LAB;  Service: Cardiovascular;  Laterality: N/A;   PERCUTANEOUS CORONARY STENT INTERVENTION (PCI-S)  03/02/2014   Procedure: PERCUTANEOUS CORONARY STENT INTERVENTION (PCI-S);  Surgeon: Candyce GORMAN Reek, MD;  Location: [facility] CATH LAB;  Service: Cardiovascular;;    Family History  Problem Relation Age of Onset   Coronary artery disease Sister 19       MI   Social History:  reports that she quit smoking about 25 years ago. Her smoking use included cigarettes. She started smoking about 70 years ago. She has a 89.9 pack-year smoking history. She has never used smokeless tobacco. She reports that she does not currently use alcohol after a past usage of about 2.0 standard drinks of alcohol per week. She reports that she does not currently use drugs.  Allergies:  Allergies  Allergen Reactions   Codeine Nausea Only   Erythromycin Nausea And Vomiting   Nsaids Nausea Only   Sulfa  Antibiotics Other (See Comments)    Dry mouth    Penicillins Itching and Rash    Medications Prior to Admission  Medication Sig Dispense Refill   Acetaminophen  (TYLENOL  ARTHRITIS PAIN PO) Take 2 tablets by mouth at bedtime.     ALPRAZolam  (XANAX ) 1 MG tablet Take 0.5 tablets by mouth at bedtime as needed.     amLODipine  (NORVASC ) 5 MG tablet Take 1 tablet (5 mg total) by mouth daily. (Patient taking differently: Take 10 mg by mouth daily at 6 (six) AM.) 90 tablet 3   Glycerin-Hypromellose-PEG 400 (DRY EYE RELIEF DROPS OP) Apply to eye as needed.     Multiple Vitamins-Minerals (PRESERVISION AREDS PO) Take 1 tablet by mouth daily.     omeprazole (PRILOSEC) 20 MG capsule Take 20 mg by mouth daily.      ondansetron  (ZOFRAN ) 4 MG tablet Take 1 tablet (4 mg total) by mouth every 8 (eight) hours as needed for nausea or vomiting. 30 tablet 1   albuterol (PROVENTIL) (2.5 MG/3ML) 0.083% nebulizer solution Inhale into the lungs.     aspirin  81 MG tablet Take 81 mg by mouth daily.     ipratropium (ATROVENT) 0.06 % nasal spray Place 2 sprays into both nostrils 4 (four) times daily.     linaclotide  (LINZESS ) 72 MCG capsule Take 1 capsule (72 mcg total) by mouth daily before breakfast. 90 capsule 3   metroNIDAZOLE (METROCREAM) 0.75 % cream Apply topically daily. As needed.     mupirocin ointment (BACTROBAN) 2 % 1 application daily. As needed. (Patient not  taking: Reported on 04/24/2024)     nitroGLYCERIN  (NITROSTAT ) 0.4 MG SL tablet Place 1 tablet (0.4 mg total) under the tongue every 5 (five) minutes x 3 doses as needed for chest pain. 25 tablet 2   OVER THE COUNTER MEDICATION Flovent HFA two puffs BID (Patient not taking: Reported on 04/24/2024)     promethazine  (PHENERGAN ) 12.5 MG tablet Take 1 tablet (12.5 mg total) by mouth every 6 (six) hours as needed for nausea or vomiting. To take with bowel prep 3 tablet 0    No results found for this or any previous visit (from the past 48 hours). No results  found.  Review of Systems  All other systems reviewed and are negative.   Blood pressure (!) 172/98, pulse 74, temperature 97.8 F (36.6 C), temperature source Oral, resp. rate 10, height 5' (1.524 m), weight 59 kg, SpO2 97%. Physical Exam  GENERAL: The patient is AO x3, in no acute distress. HEENT: Head is normocephalic and atraumatic. EOMI are intact. Mouth is well hydrated and without lesions. NECK: Supple. No masses LUNGS: Clear to auscultation. No presence of rhonchi/wheezing/rales. Adequate chest expansion HEART: RRR, normal s1 and s2. ABDOMEN: Soft, nontender, no guarding, no peritoneal signs, and nondistended. BS +. No masses. EXTREMITIES: Without any cyanosis, clubbing, rash, lesions or edema. NEUROLOGIC: AOx3, no focal motor deficit. SKIN: no jaundice, no rashes  Assessment/Plan Jodi Spencer is a 83 y.o. female with past medical history of GERD and fibromyalgia, who presents for evaluation of constipation.  Will proceed with colonoscopy.  Toribio Eartha Flavors, MD 06/07/2024, 9:39 AM

## 2024-06-07 NOTE — Anesthesia Preprocedure Evaluation (Signed)
 Anesthesia Evaluation  Patient identified by MRN, date of birth, ID band Patient awake    Reviewed: Allergy  & Precautions, H&P , NPO status , Patient's Chart, lab work & pertinent test results, reviewed documented beta blocker date and time   Airway Mallampati: II  TM Distance: >3 FB Neck ROM: full    Dental no notable dental hx.    Pulmonary neg pulmonary ROS, COPD, former smoker   Pulmonary exam normal breath sounds clear to auscultation       Cardiovascular Exercise Tolerance: Good hypertension, + CAD and + Past MI  negative cardio ROS  Rhythm:regular Rate:Normal     Neuro/Psych  Neuromuscular disease negative neurological ROS  negative psych ROS   GI/Hepatic negative GI ROS, Neg liver ROS,GERD  ,,  Endo/Other  negative endocrine ROS    Renal/GU negative Renal ROS  negative genitourinary   Musculoskeletal   Abdominal   Peds  Hematology negative hematology ROS (+)   Anesthesia Other Findings   Reproductive/Obstetrics negative OB ROS                              Anesthesia Physical Anesthesia Plan  ASA: 3  Anesthesia Plan: General   Post-op Pain Management:    Induction:   PONV Risk Score and Plan: Propofol  infusion  Airway Management Planned:   Additional Equipment:   Intra-op Plan:   Post-operative Plan:   Informed Consent: I have reviewed the patients History and Physical, chart, labs and discussed the procedure including the risks, benefits and alternatives for the proposed anesthesia with the patient or authorized representative who has indicated his/her understanding and acceptance.     Dental Advisory Given  Plan Discussed with: CRNA  Anesthesia Plan Comments:         Anesthesia Quick Evaluation

## 2024-06-07 NOTE — Op Note (Signed)
 [facility] Patient Name: Jodi Spencer Procedure Date: 06/07/2024 9:26 AM MRN: [id number] Date of Birth: 1941/08/10 Attending MD: Toribio Fortune , , [id number] CSN: [id number] Age: 83 Admit Type: Outpatient Procedure:                Colonoscopy Indications:               Providers:                Toribio Fortune, Devere Lodge, Jon Loge Referring MD:              Medicines:                Monitored Anesthesia Care Complications:            No immediate complications. Estimated Blood Loss:     Estimated blood loss: none. Procedure:                Pre-Anesthesia Assessment:                           - Prior to the procedure, a History and Physical                            was performed, and patient medications, allergies                            and sensitivities were reviewed. The patient's                            tolerance of previous anesthesia was reviewed.                           - The risks and benefits of the procedure and the                            sedation options and risks were discussed with the                            patient. All questions were answered and informed                            consent was obtained.                           - After reviewing the risks and benefits, the                            patient was deemed in satisfactory condition to                            undergo the procedure.                           - ASA Grade Assessment: II - A patient with mild                            systemic disease.  After obtaining informed consent, the colonoscope                            was passed under direct vision. Throughout the                            procedure, the patient's blood pressure, pulse, and                            oxygen  saturations were monitored continuously. The                            [id number]-[id number] ([id number]) scope was introduced through                            the anus and  advanced to the the cecum, identified                            by appendiceal orifice and ileocecal valve. The                            colonoscopy was performed without difficulty. The                            patient tolerated the procedure well. The quality                            of the bowel preparation was excellent. Scope In: 9:46:23 AM Scope Out: 10:04:33 AM Scope Withdrawal Time: 0 hours 12 minutes 24 seconds  Total Procedure Duration: 0 hours 18 minutes 10 seconds  Findings:      External hemorrhoids were found on perianal exam.      An 8 mm polyp was found in the ascending colon. The polyp was sessile.       The polyp was removed with a cold snare. Resection and retrieval were       complete.      A few small-mouthed diverticula were found in the sigmoid colon.      Non-bleeding internal hemorrhoids were found during retroflexion. The       hemorrhoids were large. Impression:               - External hemorrhoids found on perianal exam.                           - One 8 mm polyp in the ascending colon, removed                            with a cold snare. Resected and retrieved.                           - Diverticulosis in the sigmoid colon.                           - Non-bleeding internal hemorrhoids. Moderate Sedation:      Per Anesthesia Care Recommendation:           -  Discharge patient to home (ambulatory).                           - Resume previous diet.                           - Await pathology results.                           - Repeat colonoscopy is not recommended due to                            current age (39 years or older) for screening                            purposes.                           - Referral for anorectal manometry at [facility].                           - Start Amitiza  24 mcg twice a day. Procedure Code(s):        --- Professional ---                           [phone], Colonoscopy, flexible;  with removal of                            tumor(s), polyp(s), or other lesion(s) by snare                            technique Diagnosis Code(s):        --- Professional ---                           D12.2, Benign neoplasm of ascending colon                           K64.8, Other hemorrhoids                           K57.30, Diverticulosis of large intestine without                            perforation or abscess without bleeding CPT copyright 2022 American Medical Association. All rights reserved. The codes documented in this report are preliminary and upon coder review may  be revised to meet current compliance requirements. Toribio Fortune, MD Toribio Fortune,  06/07/2024 10:10:29 AM This report has been signed electronically. Number of Addenda: 0

## 2024-06-07 NOTE — Telephone Encounter (Signed)
-----   Message from Toribio Fortune Mayorga sent at 06/07/2024 10:15 AM EDT ----- Erskin Caldron, can you please refer the patient to [facility] for anorectal manometry? Dx: constipation  Thanks,   Toribio Fortune, MD Gastroenterology and Hepatology [facility] Gastroenterology

## 2024-06-07 NOTE — Transfer of Care (Addendum)
 Immediate Anesthesia Transfer of Care Note  Patient: Jodi Spencer  Procedure(s) Performed: COLONOSCOPY  Patient Location: Endoscopy Unit  Anesthesia Type:General  Level of Consciousness: drowsy and patient cooperative  Airway & Oxygen  Therapy: Patient Spontanous Breathing  Post-op Assessment: Report given to RN  Post vital signs: Reviewed and stable  Last Vitals:  Vitals Value Taken Time  BP 133/70 06/07/24   1009  Temp 36.6 06/07/24   1009  Pulse 65 06/07/24   1009  Resp 16 06/07/24   1009  SpO2 96% 06/07/24   1009    Last Pain:  Vitals:   06/07/24 0939  TempSrc:   PainSc: 0-No pain      Patients Stated Pain Goal: 5 (06/07/24 0749)  Complications: No notable events documented.

## 2024-06-08 ENCOUNTER — Encounter (INDEPENDENT_AMBULATORY_CARE_PROVIDER_SITE_OTHER): Payer: Self-pay | Admitting: *Deleted

## 2024-06-08 ENCOUNTER — Encounter (HOSPITAL_COMMUNITY): Payer: Self-pay | Admitting: Gastroenterology

## 2024-06-08 LAB — SURGICAL PATHOLOGY

## 2024-06-09 ENCOUNTER — Ambulatory Visit (INDEPENDENT_AMBULATORY_CARE_PROVIDER_SITE_OTHER): Payer: Self-pay | Admitting: Gastroenterology

## 2024-06-09 NOTE — Progress Notes (Signed)
Patient result letter mailed procedure note and pathology result faxed to PCP

## 2024-06-10 NOTE — Anesthesia Postprocedure Evaluation (Signed)
 Anesthesia Post Note  Patient: Jodi Spencer  Procedure(s) Performed: COLONOSCOPY  Patient location during evaluation: Phase II Anesthesia Type: General Level of consciousness: awake Pain management: pain level controlled Vital Signs Assessment: post-procedure vital signs reviewed and stable Respiratory status: spontaneous breathing and respiratory function stable Cardiovascular status: blood pressure returned to baseline and stable Postop Assessment: no headache and no apparent nausea or vomiting Anesthetic complications: no Comments: Late entry   No notable events documented.   Last Vitals:  Vitals:   06/07/24 0749 06/07/24 1009  BP: (!) 172/98 133/70  Pulse: 74 65  Resp: 10 16  Temp: 36.6 C 36.6 C  SpO2: 97% 96%    Last Pain:  Vitals:   06/07/24 1009  TempSrc: Oral  PainSc: 0-No pain                 Yvonna JINNY Bosworth

## 2024-06-14 DIAGNOSIS — M545 Low back pain, unspecified: Secondary | ICD-10-CM | POA: Diagnosis not present

## 2024-06-14 DIAGNOSIS — M47816 Spondylosis without myelopathy or radiculopathy, lumbar region: Secondary | ICD-10-CM | POA: Diagnosis not present

## 2024-06-14 DIAGNOSIS — M25562 Pain in left knee: Secondary | ICD-10-CM | POA: Diagnosis not present

## 2024-06-15 ENCOUNTER — Telehealth (INDEPENDENT_AMBULATORY_CARE_PROVIDER_SITE_OTHER): Payer: Self-pay | Admitting: *Deleted

## 2024-06-15 DIAGNOSIS — I10 Essential (primary) hypertension: Secondary | ICD-10-CM | POA: Diagnosis not present

## 2024-06-15 DIAGNOSIS — M47816 Spondylosis without myelopathy or radiculopathy, lumbar region: Secondary | ICD-10-CM | POA: Diagnosis not present

## 2024-06-15 DIAGNOSIS — K219 Gastro-esophageal reflux disease without esophagitis: Secondary | ICD-10-CM | POA: Diagnosis not present

## 2024-06-15 DIAGNOSIS — J449 Chronic obstructive pulmonary disease, unspecified: Secondary | ICD-10-CM | POA: Diagnosis not present

## 2024-06-15 DIAGNOSIS — M545 Low back pain, unspecified: Secondary | ICD-10-CM | POA: Diagnosis not present

## 2024-06-15 DIAGNOSIS — M5136 Other intervertebral disc degeneration, lumbar region with discogenic back pain only: Secondary | ICD-10-CM | POA: Diagnosis not present

## 2024-06-15 DIAGNOSIS — M48061 Spinal stenosis, lumbar region without neurogenic claudication: Secondary | ICD-10-CM | POA: Diagnosis not present

## 2024-06-15 NOTE — Telephone Encounter (Signed)
 Thanks for the update

## 2024-06-15 NOTE — Telephone Encounter (Signed)
 Called [facility] to check status of referral - scheduler stated when they called patient to schedule she refused apt and referral was closed

## 2024-06-20 DIAGNOSIS — Z1321 Encounter for screening for nutritional disorder: Secondary | ICD-10-CM | POA: Diagnosis not present

## 2024-06-20 DIAGNOSIS — R609 Edema, unspecified: Secondary | ICD-10-CM | POA: Diagnosis not present

## 2024-06-20 DIAGNOSIS — Z6824 Body mass index (BMI) 24.0-24.9, adult: Secondary | ICD-10-CM | POA: Diagnosis not present

## 2024-06-20 DIAGNOSIS — E785 Hyperlipidemia, unspecified: Secondary | ICD-10-CM | POA: Diagnosis not present

## 2024-06-20 DIAGNOSIS — Z1329 Encounter for screening for other suspected endocrine disorder: Secondary | ICD-10-CM | POA: Diagnosis not present

## 2024-06-20 DIAGNOSIS — R5383 Other fatigue: Secondary | ICD-10-CM | POA: Diagnosis not present

## 2024-06-20 DIAGNOSIS — I2510 Atherosclerotic heart disease of native coronary artery without angina pectoris: Secondary | ICD-10-CM | POA: Diagnosis not present

## 2024-06-20 DIAGNOSIS — I10 Essential (primary) hypertension: Secondary | ICD-10-CM | POA: Diagnosis not present

## 2024-06-20 DIAGNOSIS — E559 Vitamin D deficiency, unspecified: Secondary | ICD-10-CM | POA: Diagnosis not present

## 2024-07-14 DIAGNOSIS — J449 Chronic obstructive pulmonary disease, unspecified: Secondary | ICD-10-CM | POA: Diagnosis not present

## 2024-07-14 DIAGNOSIS — I10 Essential (primary) hypertension: Secondary | ICD-10-CM | POA: Diagnosis not present

## 2024-07-14 DIAGNOSIS — K219 Gastro-esophageal reflux disease without esophagitis: Secondary | ICD-10-CM | POA: Diagnosis not present

## 2024-08-15 DIAGNOSIS — J449 Chronic obstructive pulmonary disease, unspecified: Secondary | ICD-10-CM | POA: Diagnosis not present

## 2024-08-15 DIAGNOSIS — I10 Essential (primary) hypertension: Secondary | ICD-10-CM | POA: Diagnosis not present

## 2024-08-15 DIAGNOSIS — K219 Gastro-esophageal reflux disease without esophagitis: Secondary | ICD-10-CM | POA: Diagnosis not present

## 2024-08-24 ENCOUNTER — Ambulatory Visit (INDEPENDENT_AMBULATORY_CARE_PROVIDER_SITE_OTHER): Admitting: Gastroenterology

## 2024-08-28 ENCOUNTER — Ambulatory Visit (INDEPENDENT_AMBULATORY_CARE_PROVIDER_SITE_OTHER): Admitting: Gastroenterology

## 2024-08-29 ENCOUNTER — Ambulatory Visit (INDEPENDENT_AMBULATORY_CARE_PROVIDER_SITE_OTHER): Admitting: Gastroenterology

## 2024-08-30 ENCOUNTER — Encounter (INDEPENDENT_AMBULATORY_CARE_PROVIDER_SITE_OTHER): Payer: Self-pay | Admitting: Gastroenterology

## 2024-09-11 DIAGNOSIS — H01002 Unspecified blepharitis right lower eyelid: Secondary | ICD-10-CM | POA: Diagnosis not present
